# Patient Record
Sex: Female | Born: 1970 | Race: White | Hispanic: No | Smoking: Current every day smoker
Health system: Southern US, Community
[De-identification: ages and names within clinical notes are randomized; demographics above are authoritative.]

## PROBLEM LIST (undated history)

## (undated) DIAGNOSIS — N730 Acute parametritis and pelvic cellulitis: Secondary | ICD-10-CM

## (undated) DIAGNOSIS — N63 Unspecified lump in unspecified breast: Secondary | ICD-10-CM

## (undated) DIAGNOSIS — N879 Dysplasia of cervix uteri, unspecified: Secondary | ICD-10-CM

## (undated) DIAGNOSIS — F1910 Other psychoactive substance abuse, uncomplicated: Secondary | ICD-10-CM

## (undated) DIAGNOSIS — M1990 Unspecified osteoarthritis, unspecified site: Secondary | ICD-10-CM

## (undated) DIAGNOSIS — J40 Bronchitis, not specified as acute or chronic: Secondary | ICD-10-CM

## (undated) DIAGNOSIS — M419 Scoliosis, unspecified: Secondary | ICD-10-CM

## (undated) DIAGNOSIS — IMO0002 Reserved for concepts with insufficient information to code with codable children: Secondary | ICD-10-CM

## (undated) DIAGNOSIS — N390 Urinary tract infection, site not specified: Secondary | ICD-10-CM

## (undated) DIAGNOSIS — F419 Anxiety disorder, unspecified: Secondary | ICD-10-CM

## (undated) HISTORY — PX: LEEP: SHX91

## (undated) HISTORY — PX: DILATION AND CURETTAGE OF UTERUS: SHX78

## (undated) HISTORY — PX: COLPOSCOPY: SHX161

## (undated) HISTORY — PX: CERVICAL BIOPSY  W/ LOOP ELECTRODE EXCISION: SUR135

## (undated) HISTORY — PX: BREAST SURGERY: SHX581

## (undated) HISTORY — PX: INDUCED ABORTION: SHX677

---

## 1990-12-21 DIAGNOSIS — IMO0002 Reserved for concepts with insufficient information to code with codable children: Secondary | ICD-10-CM

## 1990-12-21 DIAGNOSIS — R87619 Unspecified abnormal cytological findings in specimens from cervix uteri: Secondary | ICD-10-CM

## 1990-12-21 HISTORY — DX: Unspecified abnormal cytological findings in specimens from cervix uteri: R87.619

## 1990-12-21 HISTORY — DX: Reserved for concepts with insufficient information to code with codable children: IMO0002

## 1998-06-22 ENCOUNTER — Emergency Department (HOSPITAL_COMMUNITY): Admission: EM | Admit: 1998-06-22 | Discharge: 1998-06-22 | Payer: Self-pay | Admitting: Emergency Medicine

## 2001-12-20 ENCOUNTER — Inpatient Hospital Stay (HOSPITAL_COMMUNITY): Admission: EM | Admit: 2001-12-20 | Discharge: 2001-12-28 | Payer: Self-pay | Admitting: Psychiatry

## 2007-09-07 ENCOUNTER — Emergency Department (HOSPITAL_COMMUNITY): Admission: EM | Admit: 2007-09-07 | Discharge: 2007-09-07 | Payer: Self-pay | Admitting: Emergency Medicine
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
## 2011-05-08 NOTE — Discharge Summary (Signed)
[facility]  Patient:    Kristine Mccarthy, Kristine Mccarthy Visit Number: [id number] MRN: [id number]          Service Type: PSY Location: 300 [id number] 01 Attending Physician:  Rachael Fee Dictated by:   Jeanice Lim, M.D. Admit Date:  12/20/2001 Discharge Date: 12/28/2001                             Discharge Summary  IDENTIFYING DATA:  This is a 40 year old divorced [name] voluntarily admitted for depression and suicidal ideation.  PAST PSYCHIATRIC HISTORY:  The patient has been detoxed in the past at [facility] and this is the first hospitalization at [facility].  The patient has a heavy alcohol use history, using crack cocaine for the last six months.  MEDICATIONS:  On Paxil in the past.  Has been off medications for eight months.  ALLERGIES:  No known drug allergies.  PHYSICAL EXAMINATION:  Unremarkable.  Neurologically nonfocal.  Been diagnosed recently with PID.  LABORATORY DATA:  Routine admission labs were essentially within normal limits.  Urine tox screen negative.  CBC with differential within normal limits.  Thyroid panel within normal limits.  MENTAL STATUS EXAMINATION:  Alert, young, middle-aged, casually dressed, cooperative [name] maintaining good eye contact.  Speech within normal limits.  Mood depressed and anxious.  Affect flat.  Thought processes goal directed.  Thought content with no psychotic symptoms.  No auditory or visual hallucinations.  No suicidal or homicidal ideation.  Cognitively intact.  Judgment and insight fair to poor.  ADMISSION DIAGNOSES: Axis I:    1. Major depressive disorder, recurrent, severe.            2. Cocaine dependence, in partial remission.            3. Alcohol abuse. Axis II:   None. Axis III:  1. Pelvic inflammatory disease.            2. Fractured fourth finger of the right hand. Axis IV:   Moderate (problems with primary support group). Axis V:    35/65.  HOSPITAL COURSE:  The patient was  ordered routine p.r.n. medications and Vistaril p.r.n.  Started on Zoloft and Seroquel p.r.n. agitation and anxiety. Neurontin was titrated on the Zoloft and Seroquel and the patient tolerated these well.  The patient tolerated medication changes without side effects, showed motivation regarding a substance abuse treatment plan.  CONDITION ON DISCHARGE:  Markedly improved with no depressive symptoms, no suicidal ideation and motivation to be abstinent.  DISCHARGE MEDICATIONS: 1. Neurontin 300 mg t.i.d. 2. Seroquel 100 mg, 1-1/2 t.i.d. 3. Zoloft 100 mg q.a.m. 4. Vistaril 50 mg q.6h.  FOLLOW-UP:  [facility] on January 02, 2002 at 8:30 a.m.  DISCHARGE DIAGNOSES: Axis I:    1. Major depressive disorder, recurrent, severe.            2. Cocaine dependence, in partial remission.            3. Alcohol abuse. Axis II:   None. Axis III:  1. Pelvic inflammatory disease.            2. Fractured fourth finger of the right hand. Axis IV:   Moderate (problems with primary support group). Axis V:    Global Assessment of Functioning on discharge 55. Dictated by:   Jeanice Lim, M.D. Attending Physician:  Rachael Fee DD:  02/01/02 TD:  02/02/02 Job: [id number]

## 2011-10-01 ENCOUNTER — Encounter (HOSPITAL_COMMUNITY): Payer: Self-pay

## 2011-10-01 ENCOUNTER — Inpatient Hospital Stay (HOSPITAL_COMMUNITY): Payer: Self-pay

## 2011-10-01 ENCOUNTER — Inpatient Hospital Stay (HOSPITAL_COMMUNITY)
Admission: AD | Admit: 2011-10-01 | Discharge: 2011-10-01 | Disposition: A | Discharge status: Home / Self Care | Payer: Self-pay | Source: Ambulatory Visit | Attending: Obstetrics & Gynecology | Admitting: Obstetrics & Gynecology

## 2011-10-01 DIAGNOSIS — O009 Unspecified ectopic pregnancy without intrauterine pregnancy: Secondary | ICD-10-CM

## 2011-10-01 DIAGNOSIS — O209 Hemorrhage in early pregnancy, unspecified: Secondary | ICD-10-CM | POA: Insufficient documentation

## 2011-10-01 HISTORY — DX: Anxiety disorder, unspecified: F41.9

## 2011-10-01 HISTORY — DX: Reserved for concepts with insufficient information to code with codable children: IMO0002

## 2011-10-01 HISTORY — DX: Dysplasia of cervix uteri, unspecified: N87.9

## 2011-10-01 LAB — URINALYSIS, ROUTINE W REFLEX MICROSCOPIC
Bilirubin Urine: NEGATIVE
Nitrite: NEGATIVE
Specific Gravity, Urine: 1.015 (ref 1.005–1.030)
Urobilinogen, UA: 0.2 mg/dL (ref 0.0–1.0)

## 2011-10-01 LAB — CREATININE, SERUM: Creatinine, Ser: 0.66 mg/dL (ref 0.50–1.10)

## 2011-10-01 LAB — CBC
HCT: 41.7 % (ref 36.0–46.0)
Hemoglobin: 13.9 g/dL (ref 12.0–15.0)
MCH: 30.5 pg (ref 26.0–34.0)
MCHC: 33.3 g/dL (ref 30.0–36.0)

## 2011-10-01 LAB — WET PREP, GENITAL

## 2011-10-01 LAB — HCG, QUANTITATIVE, PREGNANCY: hCG, Beta Chain, Quant, S: 2450 m[IU]/mL — ABNORMAL HIGH (ref <5)

## 2011-10-01 LAB — BUN: BUN: 12 mg/dL (ref 6–23)

## 2011-10-01 MED ORDER — METHOTREXATE INJECTION FOR WOMEN'S HOSPITAL
50.0000 mg/m2 | Freq: Once | INTRAMUSCULAR | Status: AC
  Administered 2011-10-01: 85 mg via INTRAMUSCULAR
  Filled 2011-10-01: qty 1.7

## 2011-10-01 NOTE — Progress Notes (Signed)
Pt states she had a POS HPT yesterday. Pt has been having some irregular bleeding, more after intercourse last night with some irregular cramping. Pt has been taking some diet pills and medicine for her cold and is concerned about it. Nausea last week, no vomiting.

## 2011-10-01 NOTE — ED Provider Notes (Signed)
History     Chief Complaint  Patient presents with  . Vaginal Bleeding   Patient is a 40 y.o. female presenting with vaginal bleeding. The history is provided by the patient.  Vaginal Bleeding This is a new problem. The current episode started yesterday. The problem occurs daily. The problem has been unchanged. Pertinent negatives include no abdominal pain, fever, nausea or vomiting.   States started bleeding yesterday after having a missed period. Took a pregnancy test which were positive. Has hx Abn Pap ("level III") and LEEP. States they "got it all".   Past Medical History  Diagnosis Date  . Abnormal Pap smear 1992    Leep, biopsy  . Anxiety   . Cervical dysplasia     level 3  . Depression     Adm. for Suicidal Ideation and Drug use in 2003    Past Surgical History  Procedure Date  . No past surgeries     History reviewed. No pertinent family history.  History  Substance Use Topics  . Smoking status: Current Everyday Smoker -- 1.0 packs/day for 26 years    Types: Cigarettes  . Smokeless tobacco: Not on file   Comment: pt states will quit b/c she is pregnant  . Alcohol Use: No    Allergies: No Known Allergies  Prescriptions prior to admission  Medication Sig Dispense Refill  . bisacodyl (EX-LAX ULTRA) 5 MG EC tablet Take 5 mg by mouth daily as needed.        Marland Kitchen guaiFENesin (MUCINEX) 600 MG 12 hr tablet Take 1,200 mg by mouth 2 (two) times daily. Patient was using this medication for congestion.       . hydrocortisone 0.5 % cream Apply 1 application topically 2 (two) times daily. Patient is using this medication for a bug bite.       Marland Kitchen ibuprofen (ADVIL,MOTRIN) 200 MG tablet Take 400 mg by mouth every 6 (six) hours as needed. Patient is using this medication for pain.       . Pseudoeph-Doxylamine-DM-APAP (NYQUIL) 60-7.05-19-999 MG/30ML LIQD Take 5 mLs by mouth daily as needed. Patient is using this medication for cold symptoms.         Review of Systems    Constitutional: Negative for fever.  Gastrointestinal: Negative for nausea, vomiting and abdominal pain.  Genitourinary: Positive for vaginal bleeding.   Physical Exam   Blood pressure 126/70, pulse 82, temperature 98.8 F (37.1 C), temperature source Oral, resp. rate 20, height 5\' 4"  (1.626 m), weight 147 lb 3.2 oz (66.769 kg), last menstrual period 08/05/2011, SpO2 98.00%.  Physical Exam  Constitutional: She is oriented to person, place, and time. She appears well-developed and well-nourished.  HENT:  Head: Normocephalic.  Respiratory: Effort normal.  GI: Soft. She exhibits no distension. There is no tenderness. There is no rebound and no guarding.  Genitourinary: Uterus normal. Vaginal discharge (moderate bloody discharge, uterus retroverted, cervix shortened due to LEEP) found.       Moderate tenderness to left of uterus which is retroverted. Slight tenderness over right adnexa.  Neurological: She is alert and oriented to person, place, and time.  Skin: Skin is warm and dry.  Psychiatric: She has a normal mood and affect.    MAU Course  Procedures  Assessment and Plan  A:  Pregnancy at 8.1 weeks First Trimester Bleeding  P:  Quant, ABO/Rh, CBC, and [place] to assess fetal well-being.   [facility] 10/01/2011, 4:17 PM   Results for orders placed during the hospital encounter  of 10/01/11 (from the past 24 hour(s))  URINALYSIS, ROUTINE W REFLEX MICROSCOPIC     Status: Normal   Collection Time   10/01/11  1:00 PM      Component Value Range   Color, Urine YELLOW  YELLOW    Appearance CLEAR  CLEAR    Specific Gravity, Urine 1.015  1.005 - 1.030    pH 8.0  5.0 - 8.0    Glucose, UA NEGATIVE  NEGATIVE (mg/dL)   Hgb urine dipstick NEGATIVE  NEGATIVE    Bilirubin Urine NEGATIVE  NEGATIVE    Ketones, ur NEGATIVE  NEGATIVE (mg/dL)   Protein, ur NEGATIVE  NEGATIVE (mg/dL)   Urobilinogen, UA 0.2  0.0 - 1.0 (mg/dL)   Nitrite NEGATIVE  NEGATIVE    Leukocytes, UA NEGATIVE   NEGATIVE   POCT PREGNANCY, URINE     Status: Normal   Collection Time   10/01/11  3:07 PM      Component Value Range   Preg Test, Ur POSITIVE    WET PREP, GENITAL     Status: Abnormal   Collection Time   10/01/11  3:57 PM      Component Value Range   Yeast, Wet Prep NONE SEEN  NONE SEEN    Trich, Wet Prep NONE SEEN  NONE SEEN    Clue Cells, Wet Prep FEW (*) NONE SEEN    WBC, Wet Prep HPF POC FEW (*) NONE SEEN   CBC     Status: Abnormal   Collection Time   10/01/11  4:03 PM      Component Value Range   WBC 10.6 (*) 4.0 - 10.5 (K/uL)   RBC 4.55  3.87 - 5.11 (MIL/uL)   Hemoglobin 13.9  12.0 - 15.0 (g/dL)   HCT 04.5  40.9 - 81.1 (%)   MCV 91.6  78.0 - 100.0 (fL)   MCH 30.5  26.0 - 34.0 (pg)   MCHC 33.3  30.0 - 36.0 (g/dL)   RDW 91.4  78.2 - 95.6 (%)   Platelets 212  150 - 400 (K/uL)  HCG, QUANTITATIVE, PREGNANCY     Status: Abnormal   Collection Time   10/01/11  4:04 PM      Component Value Range   hCG, Beta Chain, Quant, S 2450 (*) <5 (mIU/mL)  ABO/RH     Status: Normal   Collection Time   10/01/11  4:04 PM      Component Value Range   ABO/RH(D) B POS     Results reviewed. Ultrasound shows probable Right Ectopic pregnancy adjacent to Right Corpus Luteum Cyst. Discussed with Dr Penne Lash who recommends Methotrexate. Discussed with patient who is agreeable.   Will d/c home after injection and have her followup per protocol.

## 2011-10-01 NOTE — ED Provider Notes (Signed)
Agree with above note.  Kristine Stills H. 10/01/2011 11:27 PM

## 2011-10-02 ENCOUNTER — Inpatient Hospital Stay (HOSPITAL_COMMUNITY): Payer: Self-pay

## 2011-10-02 ENCOUNTER — Inpatient Hospital Stay (HOSPITAL_COMMUNITY)
Admission: AD | Admit: 2011-10-02 | Discharge: 2011-10-02 | Disposition: A | Discharge status: Home / Self Care | Payer: Self-pay | Source: Ambulatory Visit | Attending: Obstetrics & Gynecology | Admitting: Obstetrics & Gynecology

## 2011-10-02 DIAGNOSIS — R1031 Right lower quadrant pain: Secondary | ICD-10-CM | POA: Insufficient documentation

## 2011-10-02 DIAGNOSIS — O00109 Unspecified tubal pregnancy without intrauterine pregnancy: Secondary | ICD-10-CM | POA: Insufficient documentation

## 2011-10-02 DIAGNOSIS — O009 Unspecified ectopic pregnancy without intrauterine pregnancy: Secondary | ICD-10-CM

## 2011-10-02 LAB — CBC
MCH: 30.7 pg (ref 26.0–34.0)
MCHC: 34 g/dL (ref 30.0–36.0)
MCV: 90.3 fL (ref 78.0–100.0)
Platelets: 221 10*3/uL (ref 150–400)
RDW: 13.1 % (ref 11.5–15.5)

## 2011-10-02 LAB — GC/CHLAMYDIA PROBE AMP, GENITAL: GC Probe Amp, Genital: NEGATIVE

## 2011-10-02 NOTE — ED Provider Notes (Signed)
History     No chief complaint on file.  Abdominal Pain   Pt here earlier today, diagnosed with right ectopic and treated with methotrexate. About 1 hour ago began having increased RLQ pain, bleeding also slightly increased from time of discharge. States pain was 2/10 at discharge, now 10/10.  OB History    Grav Para Term Preterm Abortions TAB SAB Ect Mult Living   5 2 2  0 2 1 1  0 0 2      Past Medical History  Diagnosis Date  . Abnormal Pap smear 1992    Leep, biopsy  . Anxiety   . Cervical dysplasia     level 3  . Depression     Adm. for Suicidal Ideation and Drug use in 2003    Past Surgical History  Procedure Date  . No past surgeries     No family history on file.  History  Substance Use Topics  . Smoking status: Current Everyday Smoker -- 1.0 packs/day for 26 years    Types: Cigarettes  . Smokeless tobacco: Not on file   Comment: pt states will quit b/c she is pregnant  . Alcohol Use: No    Allergies: No Known Allergies  Prescriptions prior to admission  Medication Sig Dispense Refill  . bisacodyl (EX-LAX ULTRA) 5 MG EC tablet Take 5 mg by mouth daily as needed.        Marland Kitchen guaiFENesin (MUCINEX) 600 MG 12 hr tablet Take 1,200 mg by mouth 2 (two) times daily. Patient was using this medication for congestion.       . hydrocortisone 0.5 % cream Apply 1 application topically 2 (two) times daily. Patient is using this medication for a bug bite.       Marland Kitchen ibuprofen (ADVIL,MOTRIN) 200 MG tablet Take 400 mg by mouth every 6 (six) hours as needed. Patient is using this medication for pain.       . Pseudoeph-Doxylamine-DM-APAP (NYQUIL) 60-7.05-19-999 MG/30ML LIQD Take 5 mLs by mouth daily as needed. Patient is using this medication for cold symptoms.         Review of Systems  Cardiovascular: Negative.   Gastrointestinal: Positive for abdominal pain.  Genitourinary:       + vaginal bleeding and pain  Neurological: Negative.    Physical Exam   Last menstrual period  08/05/2011.  Physical Exam  Nursing note and vitals reviewed. Constitutional: She is oriented to person, place, and time. She appears well-developed. She appears distressed.  Respiratory: Effort normal.  GI: Soft. There is tenderness (RLQ). There is guarding. There is no rebound.  Neurological: She is alert and oriented to person, place, and time.  Skin: Skin is warm and dry.  Psychiatric: Her mood appears anxious.    MAU Course  Procedures  Results for orders placed during the hospital encounter of 10/02/11 (from the past 24 hour(s))  CBC     Status: Abnormal   Collection Time   10/02/11 12:35 AM      Component Value Range   WBC 16.9 (*) 4.0 - 10.5 (K/uL)   RBC 4.53  3.87 - 5.11 (MIL/uL)   Hemoglobin 13.9  12.0 - 15.0 (g/dL)   HCT 40.9  81.1 - 91.4 (%)   MCV 90.3  78.0 - 100.0 (fL)   MCH 30.7  26.0 - 34.0 (pg)   MCHC 34.0  30.0 - 36.0 (g/dL)   RDW 78.2  95.6 - 21.3 (%)   Platelets 221  150 - 400 (K/uL)  US Ob Comp Less 14 Wks  10/01/2011  OBSTETRICAL ULTRASOUND: This exam was performed within a [facility]. The OB US report was generated in the [facility] system, and faxed to the ordering physician.   This report is also available in [employer] and in the [employer]. See [facility] Obstetric US report.   US Ob Transvaginal  10/02/2011  *RADIOLOGY REPORT*  Clinical Data: Right adnexal ectopic pregnancy identified yesterday for which the patient received methotrexate.  Increasing pelvic pain.  TRANSVAGINAL OBSTETRIC US 10/02/2011:  Technique:  Transvaginal ultrasound was performed for complete evaluation of the gestation as well as the maternal uterus, adnexal regions, and pelvic cul-de-sac.  Comparison:  Early OB ultrasound yesterday.  Findings: The ectopic pregnancy in the right adnexa measures approximately 3.4 x 2.1 x 2.4 cm, slightly increased in size since yesterday.  Minimal adjacent free fluid is present in the right adnexa, and this fluid  appears simple.  No intrauterine gestational sac is identified, and the uterus is normal in appearance.  The left ovary is normal in appearance, containing small follicles.  IMPRESSION: Slight increase in size in the ectopic pregnancy mass in the right adnexa since yesterday, likely due to hemorrhage.  Minimal amount of simple appearing fluid in the right adnexa.  No convincing sonographic findings of rupture.  Original Report Authenticated By: Arnell Sieving, M.D.   US Ob Transvaginal  10/01/2011  OBSTETRICAL ULTRASOUND: This exam was performed within a  Ultrasound Department. The OB US report was generated in the [facility] system, and faxed to the ordering physician.   This report is also available in [employer] and in the [employer]. See [facility] Obstetric US report.    Assessment and Plan    FRAZIER,NATALIE 10/02/2011, 12:18 AM   Pt seen and examined.  Pt has no pain right now.  No rebound or guarding.  US shows no signs of rupture.  Pt was anxious when she came in, but much better now.  Discussed operating now vs waiting.  Pt will wait and see if MTX works.  Pt understands there is always a risk of rupture with an ectopic. Kylor Valverde H. 1:34 AM

## 2011-10-02 NOTE — Progress Notes (Signed)
Dr. Penne Lash at bedside.  [place] and lab results discussed with pt.

## 2011-10-02 NOTE — Progress Notes (Signed)
N. Frazier, CNM at bedside.  Assessment done and poc discussed with pt.  

## 2011-10-02 NOTE — Progress Notes (Signed)
Pt presents to [facility] for c/o pain that started about an hour ago.  Was here earlier and found to have ectopic pregnancy.  Was given methotrexate.

## 2011-10-04 ENCOUNTER — Inpatient Hospital Stay (HOSPITAL_COMMUNITY)
Admission: AD | Admit: 2011-10-04 | Discharge: 2011-10-04 | Disposition: A | Discharge status: Home / Self Care | Payer: Self-pay | Source: Ambulatory Visit | Attending: Obstetrics & Gynecology | Admitting: Obstetrics & Gynecology

## 2011-10-04 ENCOUNTER — Inpatient Hospital Stay (HOSPITAL_COMMUNITY): Admit: 2011-10-04 | Payer: Self-pay

## 2011-10-04 DIAGNOSIS — O00109 Unspecified tubal pregnancy without intrauterine pregnancy: Secondary | ICD-10-CM | POA: Insufficient documentation

## 2011-10-04 DIAGNOSIS — O009 Unspecified ectopic pregnancy without intrauterine pregnancy: Secondary | ICD-10-CM

## 2011-10-04 LAB — HCG, QUANTITATIVE, PREGNANCY: hCG, Beta Chain, Quant, S: 2668 m[IU]/mL — ABNORMAL HIGH (ref <5)

## 2011-10-04 NOTE — ED Notes (Signed)
E.Rice,PA discussed results and follow up labs. Pt verbalized understanding.

## 2011-10-04 NOTE — ED Provider Notes (Signed)
History   Pt presents today for repeat B-quant on day 4 s/p methotrexate for ectopic pregnancy. She states she is doing well but continues to have some lower abd cramping and is now have moderate vag bleeding. She denies fever, vag irritation, or worsening pain. She has no other complaints at this time.  Chief Complaint  Patient presents with  . Ectopic Pregnancy   HPI  OB History    Grav Para Term Preterm Abortions TAB SAB Ect Mult Living   5 2 2  0 2 1 1  0 0 2      Past Medical History  Diagnosis Date  . Abnormal Pap smear 1992    Leep, biopsy  . Anxiety   . Cervical dysplasia     level 3  . Depression     Adm. for Suicidal Ideation and Drug use in 2003    Past Surgical History  Procedure Date  . No past surgeries     No family history on file.  History  Substance Use Topics  . Smoking status: Current Everyday Smoker -- 1.0 packs/day for 26 years    Types: Cigarettes  . Smokeless tobacco: Not on file   Comment: pt states will quit b/c she is pregnant  . Alcohol Use: No    Allergies:  Allergies  Allergen Reactions  . Morphine And Related Nausea And Vomiting    Prescriptions prior to admission  Medication Sig Dispense Refill  . bisacodyl (EX-LAX ULTRA) 5 MG EC tablet Take 5 mg by mouth daily as needed.        Marland Kitchen guaiFENesin (MUCINEX) 600 MG 12 hr tablet Take 1,200 mg by mouth 2 (two) times daily. Patient was using this medication for congestion.       . hydrocortisone 0.5 % cream Apply 1 application topically 2 (two) times daily. Patient is using this medication for a bug bite.       . Pseudoeph-Doxylamine-DM-APAP (NYQUIL) 60-7.05-19-999 MG/30ML LIQD Take 5 mLs by mouth daily as needed. Patient is using this medication for cold symptoms.         Review of Systems  Constitutional: Negative for fever.  Cardiovascular: Negative for chest pain.  Gastrointestinal: Positive for abdominal pain. Negative for nausea, vomiting, diarrhea and constipation.    Genitourinary: Negative for dysuria, urgency, frequency and hematuria.  Neurological: Negative for dizziness and headaches.  Psychiatric/Behavioral: Negative for depression and suicidal ideas.   Physical Exam   Blood pressure 110/73, pulse 71, temperature 98.2 F (36.8 C), temperature source Oral, resp. rate 18, height 5\' 3"  (1.6 m), weight 147 lb (66.679 kg), last menstrual period 08/05/2011.  Physical Exam  Constitutional: She is oriented to person, place, and time. She appears well-developed and well-nourished. No distress.  HENT:  Head: Normocephalic and atraumatic.  Eyes: EOM are normal. Pupils are equal, round, and reactive to light.  GI: Soft. She exhibits no distension. There is no tenderness. There is no rebound and no guarding.  Neurological: She is alert and oriented to person, place, and time.  Skin: Skin is warm and dry. She is not diaphoretic.  Psychiatric: She has a normal mood and affect. Her behavior is normal. Judgment and thought content normal.    MAU Course  Procedures  Results for orders placed during the hospital encounter of 10/04/11 (from the past 24 hour(s))  HCG, QUANTITATIVE, PREGNANCY     Status: Abnormal   Collection Time   10/04/11 12:12 PM      Component Value Range   hCG,  Beta Chain, Quant, S 2668 (*) <5 (mIU/mL)     Assessment and Plan  Ectopic preg: discussed with pt at length. She will return on 10/07/11 for day 7 B-quant. Discussed diet, activity, risks, and precautions. Discussed signs and sx of ruptured ectopic.   Clinton Gallant. Trinda Harlacher III, DrHSc, MPAS, PA-C  10/04/2011, 12:56 PM   Henrietta Hoover, PA 10/04/11 1312

## 2011-10-04 NOTE — Progress Notes (Signed)
Pt reports still having lower abd pain and cramping.pt reportts having  Moderate vaginal bleeding as well.

## 2011-10-04 NOTE — ED Provider Notes (Signed)
History     Chief Complaint  Patient presents with  . Ectopic Pregnancy   HPI.Kristine Mccarthy [id number] y.o.    Past Medical History  Diagnosis Date  . Abnormal Pap smear 1992    Leep, biopsy  . Anxiety   . Cervical dysplasia     level 3  . Depression     Adm. for Suicidal Ideation and Drug use in 2003    Past Surgical History  Procedure Date  . No past surgeries     No family history on file.  History  Substance Use Topics  . Smoking status: Current Everyday Smoker -- 1.0 packs/day for 26 years    Types: Cigarettes  . Smokeless tobacco: Not on file   Comment: pt states will quit b/c she is pregnant  . Alcohol Use: No    Allergies:  Allergies  Allergen Reactions  . Morphine And Related Nausea And Vomiting    No prescriptions prior to admission    ROS Physical Exam   Blood pressure 110/73, pulse 71, temperature 98.2 F (36.8 C), temperature source Oral, resp. rate 18, height 5\' 3"  (1.6 m), weight 147 lb (66.679 kg), last menstrual period 08/05/2011.  Physical Exam  MAU Course  Procedures  MDM   Assessment and Plan   Disregard this note.  Wrong patient. KEY,EVE M 10/04/2011, 1:19 PM   Matt Holmes, NP 10/04/11 1333  Matt Holmes, NP 10/07/11 2113

## 2011-10-05 ENCOUNTER — Ambulatory Visit (HOSPITAL_COMMUNITY): Payer: Self-pay

## 2011-10-07 ENCOUNTER — Inpatient Hospital Stay (HOSPITAL_COMMUNITY)
Admission: AD | Admit: 2011-10-07 | Discharge: 2011-10-07 | Disposition: A | Discharge status: Home / Self Care | Payer: Self-pay | Source: Ambulatory Visit | Attending: Obstetrics & Gynecology | Admitting: Obstetrics & Gynecology

## 2011-10-07 DIAGNOSIS — O00109 Unspecified tubal pregnancy without intrauterine pregnancy: Secondary | ICD-10-CM | POA: Insufficient documentation

## 2011-10-07 DIAGNOSIS — O009 Unspecified ectopic pregnancy without intrauterine pregnancy: Secondary | ICD-10-CM

## 2011-10-07 LAB — DIFFERENTIAL
Eosinophils Absolute: 0.1 10*3/uL (ref 0.0–0.7)
Lymphocytes Relative: 18 % (ref 12–46)
Lymphs Abs: 1.5 10*3/uL (ref 0.7–4.0)
Neutro Abs: 6.3 10*3/uL (ref 1.7–7.7)
Neutrophils Relative %: 75 % (ref 43–77)

## 2011-10-07 LAB — CREATININE, SERUM
Creatinine, Ser: 0.65 mg/dL (ref 0.50–1.10)
GFR calc Af Amer: >90 mL/min (ref >90)
GFR calc non Af Amer: >90 mL/min (ref >90)

## 2011-10-07 LAB — BUN: BUN: 7 mg/dL (ref 6–23)

## 2011-10-07 LAB — CBC
Platelets: 237 10*3/uL (ref 150–400)
RBC: 3.96 MIL/uL (ref 3.87–5.11)
WBC: 8.4 10*3/uL (ref 4.0–10.5)

## 2011-10-07 LAB — AST: AST: 12 U/L (ref 0–37)

## 2011-10-07 LAB — HCG, QUANTITATIVE, PREGNANCY: hCG, Beta Chain, Quant, S: 2241 m[IU]/mL — ABNORMAL HIGH (ref <5)

## 2011-10-07 MED ORDER — METHOTREXATE INJECTION FOR WOMEN'S HOSPITAL
50.0000 mg/m2 | Freq: Once | INTRAMUSCULAR | Status: AC
  Administered 2011-10-07: 85 mg via INTRAMUSCULAR
  Filled 2011-10-07: qty 1.7

## 2011-10-07 NOTE — ED Provider Notes (Signed)
History   Pt presents today for B-quant on day 7 s/p methotrexate for ectopic preg. She states she is doing well and is feeling much improved. Her abd pain has almost completely resolved as has her bleeding. She denies fever or any other sx at this time.  Chief Complaint  Patient presents with  . Follow-up   HPI  OB History    Grav Para Term Preterm Abortions TAB SAB Ect Mult Living   5 2 2  0 2 1 1  0 0 2      Past Medical History  Diagnosis Date  . Abnormal Pap smear 1992    Leep, biopsy  . Anxiety   . Cervical dysplasia     level 3  . Depression     Adm. for Suicidal Ideation and Drug use in 2003    Past Surgical History  Procedure Date  . No past surgeries     No family history on file.  History  Substance Use Topics  . Smoking status: Current Everyday Smoker -- 1.0 packs/day for 26 years    Types: Cigarettes  . Smokeless tobacco: Not on file   Comment: pt states will quit b/c she is pregnant  . Alcohol Use: No    Allergies:  Allergies  Allergen Reactions  . Morphine And Related Nausea And Vomiting    Prescriptions prior to admission  Medication Sig Dispense Refill  . bisacodyl (EX-LAX ULTRA) 5 MG EC tablet Take 5 mg by mouth daily as needed.        Marland Kitchen guaiFENesin (MUCINEX) 600 MG 12 hr tablet Take 1,200 mg by mouth 2 (two) times daily. Patient was using this medication for congestion.       . hydrocortisone 0.5 % cream Apply 1 application topically 2 (two) times daily. Patient is using this medication for a bug bite.       . Pseudoeph-Doxylamine-DM-APAP (NYQUIL) 60-7.05-19-999 MG/30ML LIQD Take 5 mLs by mouth daily as needed. Patient is using this medication for cold symptoms.         Review of Systems  Constitutional: Negative for fever.  Cardiovascular: Negative for chest pain.  Gastrointestinal: Negative for nausea, vomiting, abdominal pain, diarrhea and constipation.  Genitourinary: Negative for dysuria, urgency and frequency.  Neurological: Negative  for dizziness and headaches.  Psychiatric/Behavioral: Negative for depression and suicidal ideas.   Physical Exam   Blood pressure 129/75, pulse 88, temperature 99 F (37.2 C), temperature source Oral, resp. rate 20, last menstrual period 08/05/2011.  Physical Exam  Nursing note and vitals reviewed. Constitutional: She is oriented to person, place, and time. She appears well-developed and well-nourished. No distress.  HENT:  Head: Normocephalic and atraumatic.  Eyes: EOM are normal. Pupils are equal, round, and reactive to light.  GI: Soft. She exhibits no distension. There is no tenderness. There is no rebound and no guarding.  Neurological: She is alert and oriented to person, place, and time.  Skin: Skin is warm and dry. She is not diaphoretic.  Psychiatric: She has a normal mood and affect. Her behavior is normal. Judgment and thought content normal.    MAU Course  Procedures  B-quant on 10/01/11 - 2450                    10/04/11 - 2668                    10/07/11 - 2241  Discussed pt with Dr. Macon Large. Will give 2nd dose of methotrexate.  Assessment and Plan  Ectopic preg: discussed with pt at length. She will receive 2nd dose of methotrexate. She will return on 10/10/11 for day 4 B-quant. Discussed signs and sx of ruptured ectopic preg with pt at length. She will return immediately to the MAU with worsening sx. Discussed diet, activity, risks, and precautions. She understood and agreed.  Clinton Gallant. Rice III, DrHSc, MPAS, PA-C  10/07/2011, 11:14 AM   Henrietta Hoover, PA 10/07/11 1118

## 2011-10-07 NOTE — Progress Notes (Signed)
Pt to MAU for day 7 BHCG s/p MTX. Pt states she is not having any bleeding and no pain at this time. Does take Tylenol occasionally for discomfort.

## 2011-10-08 NOTE — ED Provider Notes (Signed)
Attestation of Attending Supervision of Advanced Practitioner: Evaluation and management procedures were performed by the PA/NP/CNM/OB Fellow under my supervision/collaboration. Chart reviewed and agree with management and plan.  Shanetha Bradham A 10/08/2011 2:53 PM

## 2011-10-10 ENCOUNTER — Inpatient Hospital Stay (HOSPITAL_COMMUNITY)
Admission: AD | Admit: 2011-10-10 | Discharge: 2011-10-10 | Disposition: A | Discharge status: Home / Self Care | Payer: Self-pay | Source: Ambulatory Visit | Attending: Obstetrics & Gynecology | Admitting: Obstetrics & Gynecology

## 2011-10-10 DIAGNOSIS — O009 Unspecified ectopic pregnancy without intrauterine pregnancy: Secondary | ICD-10-CM | POA: Diagnosis present

## 2011-10-10 DIAGNOSIS — R109 Unspecified abdominal pain: Secondary | ICD-10-CM | POA: Insufficient documentation

## 2011-10-10 DIAGNOSIS — J40 Bronchitis, not specified as acute or chronic: Secondary | ICD-10-CM

## 2011-10-10 DIAGNOSIS — O00109 Unspecified tubal pregnancy without intrauterine pregnancy: Secondary | ICD-10-CM | POA: Insufficient documentation

## 2011-10-10 MED ORDER — AMOXICILLIN-POT CLAVULANATE 875-125 MG PO TABS: 1.0000 | ORAL_TABLET | Freq: Two times a day (BID) | ORAL | Status: AC

## 2011-10-10 MED ORDER — ALBUTEROL SULFATE HFA 108 (90 BASE) MCG/ACT IN AERS: 2.0000 | INHALATION_SPRAY | RESPIRATORY_TRACT | Status: DC | PRN

## 2011-10-10 NOTE — ED Provider Notes (Signed)
Attestation of Attending Supervision of Advanced Practitioner: Evaluation and management procedures were performed by the PA/NP/CNM/OB Fellow under my supervision/collaboration. Chart reviewed and agree with management and plan.  Daly Whipkey A 10/10/2011 4:53 PM

## 2011-10-10 NOTE — Progress Notes (Signed)
Received second shot of Methotrexate 4 days ago patient has cough, having lower abdominal pain.

## 2011-10-10 NOTE — ED Provider Notes (Signed)
History     Chief Complaint  Patient presents with  . Abdominal Pain  . Ectopic Pregnancy   HPI Day 4 s/p MTX, some low abdomen pain, not severe, no bleeding. Also c/o productive cough with green sputum x 3 weeks.   OB History    Grav Para Term Preterm Abortions TAB SAB Ect Mult Living   5 2 2  0 2 1 1  0 0 2      Past Medical History  Diagnosis Date  . Abnormal Pap smear 1992    Leep, biopsy  . Anxiety   . Cervical dysplasia     level 3  . Depression     Adm. for Suicidal Ideation and Drug use in 2003    Past Surgical History  Procedure Date  . No past surgeries     No family history on file.  History  Substance Use Topics  . Smoking status: Current Everyday Smoker -- 1.0 packs/day for 26 years    Types: Cigarettes  . Smokeless tobacco: Not on file   Comment: pt states will quit b/c she is pregnant  . Alcohol Use: No    Allergies:  Allergies  Allergen Reactions  . Morphine And Related Nausea And Vomiting    Prescriptions prior to admission  Medication Sig Dispense Refill  . bisacodyl (EX-LAX ULTRA) 5 MG EC tablet Take 5 mg by mouth daily as needed.        Marland Kitchen guaiFENesin (MUCINEX) 600 MG 12 hr tablet Take 1,200 mg by mouth 2 (two) times daily. Patient was using this medication for congestion.       . hydrocortisone 0.5 % cream Apply 1 application topically 2 (two) times daily. Patient is using this medication for a bug bite.       . Pseudoeph-Doxylamine-DM-APAP (NYQUIL) 60-7.05-19-999 MG/30ML LIQD Take 5 mLs by mouth daily as needed. Patient is using this medication for cold symptoms.         Review of Systems  Constitutional: Negative.   HENT: Negative.   Respiratory: Positive for cough.   Cardiovascular: Negative.   Gastrointestinal: Positive for abdominal pain.  Genitourinary: Negative.   Musculoskeletal: Negative.   Psychiatric/Behavioral: Negative.    Physical Exam   Blood pressure 107/78, pulse 94, temperature 98.3 F (36.8 C), temperature  source Oral, resp. rate 18, last menstrual period 08/05/2011.  Physical Exam  Constitutional: She is oriented to person, place, and time. She appears well-developed and well-nourished. No distress.  Cardiovascular: Normal rate and regular rhythm.   Respiratory: Effort normal. She has wheezes.  Musculoskeletal: Normal range of motion.  Neurological: She is alert and oriented to person, place, and time.  Skin: Skin is warm and dry.  Psychiatric: She has a normal mood and affect.    MAU Course  Procedures  Results for orders placed during the hospital encounter of 10/10/11 (from the past 24 hour(s))  HCG, QUANTITATIVE, PREGNANCY     Status: Abnormal   Collection Time   10/10/11  1:12 PM      Component Value Range   hCG, Beta Chain, Quant, S 1725 (*) <5 (mIU/mL)     Assessment and Plan  Ectopic pregnancy s/p MTX - decreasing HCG, Return on day 7 for repeat quant, precautions rev'd Bronchitis - augmentin, albuterol    FRAZIER,NATALIE 10/10/2011, 2:05 PM

## 2011-10-13 ENCOUNTER — Inpatient Hospital Stay (HOSPITAL_COMMUNITY)
Admission: AD | Admit: 2011-10-13 | Discharge: 2011-10-13 | Disposition: A | Discharge status: Home / Self Care | Payer: Self-pay | Source: Ambulatory Visit | Attending: Obstetrics & Gynecology | Admitting: Obstetrics & Gynecology

## 2011-10-13 DIAGNOSIS — O00209 Unspecified ovarian pregnancy without intrauterine pregnancy: Secondary | ICD-10-CM

## 2011-10-13 DIAGNOSIS — O00109 Unspecified tubal pregnancy without intrauterine pregnancy: Secondary | ICD-10-CM | POA: Insufficient documentation

## 2011-10-13 NOTE — ED Provider Notes (Signed)
History     Chief Complaint  Patient presents with  . Follow-up   HPI Kristine Mccarthy [id number] y.o.returns for follow up BHCG after Methotrexate for ectopic pregnancy.  BHCG on 10/14 2668, 10/17 2241, and 10/20 1725.  This is Day7 after second MTX injection.  She denies pain today but does report light bleeding.  Treated last visit for Bronchitis. Beginning to feel better.  Using cough med, antibiotic and inhaler.   Past Medical History  Diagnosis Date  . Abnormal Pap smear 1992    Leep, biopsy  . Anxiety   . Cervical dysplasia     level 3  . Depression     Adm. for Suicidal Ideation and Drug use in 2003    Past Surgical History  Procedure Date  . No past surgeries     No family history on file.  History  Substance Use Topics  . Smoking status: Current Everyday Smoker -- 1.0 packs/day for 26 years    Types: Cigarettes  . Smokeless tobacco: Not on file   Comment: pt states will quit b/c she is pregnant  . Alcohol Use: No    Allergies:  Allergies  Allergen Reactions  . Morphine And Related Nausea And Vomiting    Prescriptions prior to admission  Medication Sig Dispense Refill  . albuterol (PROVENTIL HFA;VENTOLIN HFA) 108 (90 BASE) MCG/ACT inhaler Inhale 2 puffs into the lungs every 4 (four) hours as needed for wheezing or shortness of breath.  1 Inhaler  0  . amoxicillin-clavulanate (AUGMENTIN) 875-125 MG per tablet Take 1 tablet by mouth 2 (two) times daily.  14 tablet  0  . bisacodyl (EX-LAX ULTRA) 5 MG EC tablet Take 5 mg by mouth daily as needed.        Marland Kitchen guaiFENesin (MUCINEX) 600 MG 12 hr tablet Take 1,200 mg by mouth 2 (two) times daily. Patient was using this medication for congestion.       . hydrocortisone 0.5 % cream Apply 1 application topically 2 (two) times daily. Patient is using this medication for a bug bite.       . Pseudoeph-Doxylamine-DM-APAP (NYQUIL) 60-7.05-19-999 MG/30ML LIQD Take 5 mLs by mouth daily as needed. Patient is using this medication for  cold symptoms.         Review of Systems  Respiratory: Positive for cough.   Gastrointestinal: Negative for abdominal pain.  Genitourinary:       Light vaginal bleeding   Physical Exam   Blood pressure 122/72, pulse 88, temperature 98.3 F (36.8 C), temperature source Oral, resp. rate 20, last menstrual period 08/05/2011.  Physical Exam  Not indicated Results for orders placed during the hospital encounter of 10/13/11 (from the past 24 hour(s))  HCG, QUANTITATIVE, PREGNANCY     Status: Abnormal   Collection Time   10/13/11 12:20 PM      Component Value Range   hCG, Beta Chain, Quant, S 1135 (*) <5 (mIU/mL)    MAU Course  Procedures  MDM   Assessment and Plan  A;  Followup after Metrotrexate for ectopic pregnancy  P:  Return for BHCG in 1 week.  Return sooner for increased abdominal pain or heavy vaginal bleeding.    Heath Badon,EVE M 10/13/2011, 12:53 PM   Matt Holmes, NP 10/13/11 1629  Matt Holmes, NP 10/15/11 1336

## 2011-10-13 NOTE — Progress Notes (Signed)
Patient to MAU for repeat BHCG day 7 s/p MTX second dose. Patient denies any pain but does have a little bleeding.

## 2011-10-13 NOTE — Plan of Care (Signed)
Pt is in the lab having her blood drawn when called to triage.

## 2011-10-15 NOTE — ED Provider Notes (Signed)
Attestation of Attending Supervision of Advanced Practitioner: Evaluation and management procedures were performed by the PA/NP/CNM/OB Fellow under my supervision/collaboration. Chart reviewed and agree with management and plan.  Jaynie Collins A M.D. 10/15/2011 5:27 PM

## 2011-10-20 ENCOUNTER — Encounter (HOSPITAL_COMMUNITY): Payer: Self-pay

## 2011-10-20 ENCOUNTER — Inpatient Hospital Stay (HOSPITAL_COMMUNITY)
Admission: AD | Admit: 2011-10-20 | Discharge: 2011-10-20 | Disposition: A | Discharge status: Home / Self Care | Payer: Self-pay | Source: Ambulatory Visit | Attending: Obstetrics & Gynecology | Admitting: Obstetrics & Gynecology

## 2011-10-20 DIAGNOSIS — O009 Unspecified ectopic pregnancy without intrauterine pregnancy: Secondary | ICD-10-CM

## 2011-10-20 DIAGNOSIS — O00109 Unspecified tubal pregnancy without intrauterine pregnancy: Secondary | ICD-10-CM | POA: Insufficient documentation

## 2011-10-20 NOTE — Progress Notes (Signed)
Pt here for repeat bhcg, was given 2nd MTX dose, denies pain, still bleeding lightly.

## 2011-10-20 NOTE — ED Provider Notes (Signed)
History   Pt presents today for repeat B-quant s/p 2nd dose of methotrexate. Pt states she is doing well and has no complaints. She denies abd pain and is only having minimal bleeding. She denies fever or any other sx at this time.  Chief Complaint  Patient presents with  . Labs Only   HPI  OB History    Grav Para Term Preterm Abortions TAB SAB Ect Mult Living   5 2 2  0 3 1 1 1  0 2      Past Medical History  Diagnosis Date  . Abnormal Pap smear 1992    Leep, biopsy  . Anxiety   . Cervical dysplasia     level 3  . Depression     Adm. for Suicidal Ideation and Drug use in 2003    Past Surgical History  Procedure Date  . No past surgeries     No family history on file.  History  Substance Use Topics  . Smoking status: Current Everyday Smoker -- 1.0 packs/day for 26 years    Types: Cigarettes  . Smokeless tobacco: Not on file   Comment: pt states will quit b/c she is pregnant  . Alcohol Use: No    Allergies:  Allergies  Allergen Reactions  . Morphine And Related Nausea And Vomiting    Prescriptions prior to admission  Medication Sig Dispense Refill  . albuterol (PROVENTIL HFA;VENTOLIN HFA) 108 (90 BASE) MCG/ACT inhaler Inhale 2 puffs into the lungs every 4 (four) hours as needed for wheezing or shortness of breath.  1 Inhaler  0  . amoxicillin-clavulanate (AUGMENTIN) 875-125 MG per tablet Take 1 tablet by mouth 2 (two) times daily.  14 tablet  0  . bisacodyl (EX-LAX ULTRA) 5 MG EC tablet Take 5 mg by mouth daily as needed.        Marland Kitchen guaiFENesin (MUCINEX) 600 MG 12 hr tablet Take 1,200 mg by mouth 2 (two) times daily. Patient was using this medication for congestion.       . hydrocortisone 0.5 % cream Apply 1 application topically 2 (two) times daily. Patient is using this medication for a bug bite.       . Pseudoeph-Doxylamine-DM-APAP (NYQUIL) 60-7.05-19-999 MG/30ML LIQD Take 5 mLs by mouth daily as needed. Patient is using this medication for cold symptoms.          Review of Systems  Constitutional: Negative for fever.  Cardiovascular: Negative for chest pain.  Gastrointestinal: Negative for nausea, vomiting, abdominal pain, diarrhea and constipation.  Genitourinary: Negative for dysuria, urgency, frequency and hematuria.  Neurological: Negative for dizziness and headaches.  Psychiatric/Behavioral: Negative for depression and suicidal ideas.   Physical Exam   Blood pressure 122/75, pulse 78, temperature 97.9 F (36.6 C), temperature source Oral, resp. rate 16, height 5\' 3"  (1.6 m), weight 147 lb 4 oz (66.792 kg), last menstrual period 08/05/2011, unknown if currently breastfeeding.  Physical Exam  Nursing note and vitals reviewed. Constitutional: She is oriented to person, place, and time. She appears well-developed and well-nourished. No distress.  HENT:  Head: Normocephalic and atraumatic.  Eyes: EOM are normal. Pupils are equal, round, and reactive to light.  GI: Soft. She exhibits no distension. There is no tenderness. There is no rebound and no guarding.  Neurological: She is alert and oriented to person, place, and time.  Skin: Skin is warm and dry. She is not diaphoretic.  Psychiatric: She has a normal mood and affect. Her behavior is normal. Judgment and thought content  normal.    MAU Course  Procedures  Results for orders placed during the hospital encounter of 10/20/11 (from the past 24 hour(s))  HCG, QUANTITATIVE, PREGNANCY     Status: Abnormal   Collection Time   10/20/11  8:38 AM      Component Value Range   hCG, Beta Chain, Quant, S 125 (*) <5 (mIU/mL)     Assessment and Plan  Ectopic preg: pt has had an appropriate drop in her B-quant. She will return in 1wk for repeat B-quant. Discussed diet, activity, risks, and precautions.  Clinton Gallant. Rice III, DrHSc, MPAS, PA-C  10/20/2011, 9:56 AM   Henrietta Hoover, PA 10/20/11 1000

## 2011-10-20 NOTE — ED Notes (Signed)
Dr. Dimple Casey spoke with pt in triage with finalized lab results, then d/c'd pt who voiced understanding of f/u appts, reasons to return sooner.

## 2011-10-27 ENCOUNTER — Inpatient Hospital Stay (HOSPITAL_COMMUNITY)
Admission: AD | Admit: 2011-10-27 | Discharge: 2011-10-27 | Disposition: A | Discharge status: Home / Self Care | Payer: Self-pay | Source: Ambulatory Visit | Attending: Obstetrics & Gynecology | Admitting: Obstetrics & Gynecology

## 2011-10-27 ENCOUNTER — Encounter (HOSPITAL_COMMUNITY): Payer: Self-pay | Admitting: *Deleted

## 2011-10-27 DIAGNOSIS — O00109 Unspecified tubal pregnancy without intrauterine pregnancy: Secondary | ICD-10-CM | POA: Insufficient documentation

## 2011-10-27 HISTORY — DX: Urinary tract infection, site not specified: N39.0

## 2011-10-27 HISTORY — DX: Acute parametritis and pelvic cellulitis: N73.0

## 2011-10-27 LAB — HCG, QUANTITATIVE, PREGNANCY: hCG, Beta Chain, Quant, S: 7 m[IU]/mL — ABNORMAL HIGH (ref <5)

## 2011-10-27 NOTE — Progress Notes (Signed)
Pt here post MTX.  Received 2 doses. Doing much better, small amt of d/c- bleeding continues.

## 2011-10-27 NOTE — ED Provider Notes (Signed)
History     Chief Complaint  Patient presents with  . Follow-up   HPI 40 y.o. who is s/p Methotrexate for ectopic pregnancy presents for followup quant HCG.  Feels well and has a small amount of spotting. No fever or pain.    Past Medical History  Diagnosis Date  . Abnormal Pap smear 1992    Leep, biopsy  . Cervical dysplasia     level 3  . Urinary tract infection   . PID (acute pelvic inflammatory disease)     Past Surgical History  Procedure Date  . Dilation and curettage of uterus   . Induced abortion   . Leep     Family History  Problem Relation Age of Onset  . Heart disease Father   . Cancer Maternal Grandmother   . Heart disease Maternal Grandmother     History  Substance Use Topics  . Smoking status: Current Everyday Smoker -- 1.0 packs/day for 26 years    Types: Cigarettes  . Smokeless tobacco: Never Used   Comment: pt states will quit b/c she is pregnant  . Alcohol Use: No    Allergies:  Allergies  Allergen Reactions  . Morphine And Related Nausea And Vomiting    Prescriptions prior to admission  Medication Sig Dispense Refill  . albuterol (PROVENTIL HFA;VENTOLIN HFA) 108 (90 BASE) MCG/ACT inhaler Inhale 2 puffs into the lungs every 4 (four) hours as needed for wheezing or shortness of breath.  1 Inhaler  0  . bisacodyl (EX-LAX ULTRA) 5 MG EC tablet Take 5 mg by mouth daily as needed.        Marland Kitchen guaiFENesin (MUCINEX) 600 MG 12 hr tablet Take 1,200 mg by mouth 2 (two) times daily. Patient was using this medication for congestion.       . hydrocortisone 0.5 % cream Apply 1 application topically 2 (two) times daily. Patient is using this medication for a bug bite.       . Pseudoeph-Doxylamine-DM-APAP (NYQUIL) 60-7.05-19-999 MG/30ML LIQD Take 5 mLs by mouth daily as needed. Patient is using this medication for cold symptoms.         ROS See above  Physical Exam   Blood pressure 121/65, pulse 89, temperature 98.7 F (37.1 C), temperature source Oral,  resp. rate 18, last menstrual period 08/05/2011, SpO2 95.00%, unknown if currently breastfeeding.  Physical Exam  Constitutional: She is oriented to person, place, and time. She appears well-developed and well-nourished.  HENT:  Head: Normocephalic.  Cardiovascular: Normal rate.   Respiratory: Effort normal.  GI: Soft. She exhibits no distension. There is no tenderness.  Musculoskeletal: Normal range of motion.  Neurological: She is alert and oriented to person, place, and time.  Skin: Skin is warm and dry.  Quant 7  MAU Course  Procedures  Assessment and Plan  A:  S/P Methotrexate       Appropriately declining Quants P:  Discussed with Dr Macon Large      Do one more next week then followup in clinic  [facility] 10/27/2011, 10:06 AM

## 2011-10-27 NOTE — ED Provider Notes (Signed)
Attestation of Attending Supervision of Advanced Practitioner: Evaluation and management procedures were performed by the PA/NP/CNM/OB Fellow under my supervision/collaboration. Chart reviewed, and agree with management and plan.  Jaynie Collins A M.D. 10/27/2011 12:41 PM

## 2011-11-10 ENCOUNTER — Ambulatory Visit (HOSPITAL_COMMUNITY): Payer: Self-pay

## 2011-12-07 ENCOUNTER — Encounter: Payer: Self-pay | Admitting: Obstetrics and Gynecology

## 2011-12-28 ENCOUNTER — Encounter: Payer: Self-pay | Admitting: Advanced Practice Midwife

## 2013-02-28 ENCOUNTER — Emergency Department (HOSPITAL_COMMUNITY)
Admission: EM | Admit: 2013-02-28 | Discharge: 2013-02-28 | Disposition: A | Discharge status: Rehabilitation Facility | Payer: Self-pay | Attending: Emergency Medicine | Admitting: Emergency Medicine

## 2013-02-28 ENCOUNTER — Encounter (HOSPITAL_COMMUNITY): Payer: Self-pay | Admitting: Emergency Medicine

## 2013-02-28 DIAGNOSIS — F1910 Other psychoactive substance abuse, uncomplicated: Secondary | ICD-10-CM

## 2013-02-28 DIAGNOSIS — F1310 Sedative, hypnotic or anxiolytic abuse, uncomplicated: Secondary | ICD-10-CM | POA: Insufficient documentation

## 2013-02-28 DIAGNOSIS — F17200 Nicotine dependence, unspecified, uncomplicated: Secondary | ICD-10-CM | POA: Insufficient documentation

## 2013-02-28 DIAGNOSIS — F1410 Cocaine abuse, uncomplicated: Secondary | ICD-10-CM | POA: Insufficient documentation

## 2013-02-28 DIAGNOSIS — F1010 Alcohol abuse, uncomplicated: Secondary | ICD-10-CM | POA: Insufficient documentation

## 2013-02-28 DIAGNOSIS — Z8742 Personal history of other diseases of the female genital tract: Secondary | ICD-10-CM | POA: Insufficient documentation

## 2013-02-28 DIAGNOSIS — F1210 Cannabis abuse, uncomplicated: Secondary | ICD-10-CM | POA: Insufficient documentation

## 2013-02-28 DIAGNOSIS — Z3202 Encounter for pregnancy test, result negative: Secondary | ICD-10-CM | POA: Insufficient documentation

## 2013-02-28 DIAGNOSIS — Z87440 Personal history of urinary (tract) infections: Secondary | ICD-10-CM | POA: Insufficient documentation

## 2013-02-28 LAB — COMPREHENSIVE METABOLIC PANEL
ALT: 60 U/L — ABNORMAL HIGH (ref 0–35)
AST: 44 U/L — ABNORMAL HIGH (ref 0–37)
Albumin: 3.6 g/dL (ref 3.5–5.2)
CO2: 24 mEq/L (ref 19–32)
Calcium: 9 mg/dL (ref 8.4–10.5)
Chloride: 103 mEq/L (ref 96–112)
Creatinine, Ser: 0.89 mg/dL (ref 0.50–1.10)
GFR calc non Af Amer: 79 mL/min — ABNORMAL LOW (ref >90)
Sodium: 138 mEq/L (ref 135–145)

## 2013-02-28 LAB — SALICYLATE LEVEL: Salicylate Lvl: <2 mg/dL — ABNORMAL LOW (ref 2.8–20.0)

## 2013-02-28 LAB — POCT PREGNANCY, URINE: Preg Test, Ur: NEGATIVE

## 2013-02-28 LAB — RAPID URINE DRUG SCREEN, HOSP PERFORMED
Barbiturates: NOT DETECTED
Cocaine: POSITIVE — AB
Opiates: NOT DETECTED

## 2013-02-28 LAB — CBC
MCH: 28.8 pg (ref 26.0–34.0)
MCV: 87.2 fL (ref 78.0–100.0)
Platelets: 276 10*3/uL (ref 150–400)
RBC: 4.52 MIL/uL (ref 3.87–5.11)
RDW: 13.6 % (ref 11.5–15.5)
WBC: 12.2 10*3/uL — ABNORMAL HIGH (ref 4.0–10.5)

## 2013-02-28 MED ORDER — ZOLPIDEM TARTRATE 5 MG PO TABS: 5.0000 mg | ORAL_TABLET | Freq: Every evening | ORAL | Status: DC | PRN

## 2013-02-28 MED ORDER — ONDANSETRON HCL 4 MG PO TABS: 4.0000 mg | ORAL_TABLET | Freq: Three times a day (TID) | ORAL | Status: DC | PRN

## 2013-02-28 MED ORDER — LORAZEPAM 1 MG PO TABS: 1.0000 mg | ORAL_TABLET | Freq: Three times a day (TID) | ORAL | Status: DC | PRN

## 2013-02-28 MED ORDER — ALUM & MAG HYDROXIDE-SIMETH 200-200-20 MG/5ML PO SUSP: 30.0000 mL | ORAL | Status: DC | PRN

## 2013-02-28 MED ORDER — NICOTINE 21 MG/24HR TD PT24: 21.0000 mg | MEDICATED_PATCH | Freq: Every day | TRANSDERMAL | Status: DC

## 2013-02-28 MED ORDER — ACETAMINOPHEN 325 MG PO TABS: 650.0000 mg | ORAL_TABLET | ORAL | Status: DC | PRN

## 2013-02-28 MED ORDER — IBUPROFEN 600 MG PO TABS
600.0000 mg | ORAL_TABLET | Freq: Three times a day (TID) | ORAL | Status: DC | PRN
  Administered 2013-02-28: 600 mg via ORAL
  Filled 2013-02-28: qty 1

## 2013-02-28 NOTE — Clinical Social Work Note (Signed)
CSW called [facility] at [phone] who stated that our pt was not a pt of theirs.  Pt may have just called for information on tx/detox.    Vickii Penna, LCSWA [phone]  Clinical Social Work

## 2013-02-28 NOTE — ED Notes (Signed)
States that she was sent for medical clearance from [facility]. States that she wants help with ETOH, marijuana, and cocaine. States that she is tried of waking up sick. Last used ETOH 1 hr ago (2-3 beers), Cocaine last pm, marijuana 2-3 days ago.

## 2013-02-28 NOTE — ED Notes (Signed)
Requested PA-C screen pt.

## 2013-02-28 NOTE — BH Assessment (Signed)
Assessment Note   Kristine Mccarthy is an 42 y.o. female. Patient with past medical history of polysubstance abuse presents to the ED for detox. Patient would like to detox from Alcohol. She also regularly uses cocaine and THC. Patient occasionally uses Benzodiazepines (xanax and klonopin).  Patient reports using these substances frequently and is "tired of waking up sick everyday". Patient reports withdrawals symptoms of nausea, vomiting, tremors, tingling, hot/cold flashes, anxiety, etc.  Patient reports being sober previously and would like help. Says that her alcohol and drug use has caused her to loss her job, housing, and family. Patient is motivated to reconnect with her family stating, "My daughter will not let me see my grandchildren because of my substance abuse".  She denies SI/HI. Previous history of cutting, no current issues. Patient admitting to mild depression as she feels guilty and hopeless. No AVH's. Patient hospitalized in the past ("over 15 yrs ago) at [facility], [facility], and [facility].     Axis I: Polystubstance Abuse Axis II: Deferred Axis III:  Past Medical History  Diagnosis Date  . Abnormal Pap smear 1992    Leep, biopsy  . Cervical dysplasia     level 3  . Urinary tract infection   . PID (acute pelvic inflammatory disease)    Axis IV: economic problems, housing problems, other psychosocial or environmental problems, problems related to social environment, problems with access to health care services and problems with primary support group Axis V: 31-40 impairment in reality testing  Past Medical History:  Past Medical History  Diagnosis Date  . Abnormal Pap smear 1992    Leep, biopsy  . Cervical dysplasia     level 3  . Urinary tract infection   . PID (acute pelvic inflammatory disease)     Past Surgical History  Procedure Laterality Date  . Dilation and curettage of uterus    . Induced abortion    . Leep      Family History:  Family History  Problem  Relation Age of Onset  . Heart disease Father   . Cancer Maternal Grandmother   . Heart disease Maternal Grandmother     Social History:  reports that she has been smoking Cigarettes.  She has a 26 pack-year smoking history. She has never used smokeless tobacco. She reports that  drinks alcohol. She reports that she uses illicit drugs (Cocaine and Marijuana).  Additional Social History:  Alcohol / Drug Use Pain Medications: SEE MAR Prescriptions: SEE MAR Over the Counter: SEE MAR History of alcohol / drug use?: Yes Longest period of sobriety (when/how long): "few days" Negative Consequences of Use: Financial;Legal;Personal relationships;Work / Programmer, multimedia Withdrawal Symptoms: Agitation;Blackouts;Sweats;Patient aware of relationship between substance abuse and physical/medical complications;Nausea / Vomiting;Weakness;Irritability;Fever / Chills Substance #1 Name of Substance 1: Alcohol  1 - Age of First Use: 42 yrs old 1 - Amount (size/oz): 1 case or more daily 1 - Frequency: daily  1 - Duration: 3-4 yrs  1 - Last Use / Amount: 02/28/2013; "this morning"; 1 beer Substance #2 Name of Substance 2: Cocaine  2 - Age of First Use: 42 yrs old  2 - Amount (size/oz): $20-$100 per use 2 - Frequency: 3-4x's per week  2 - Duration: 7 yrs 2 - Last Use / Amount: 02/27/2013; "last night" Substance #3 Name of Substance 3: THC 3 - Age of First Use: 13 yrs 3 - Amount (size/oz): 1 joint per use 3 - Frequency: 3-4x's per month 3 - Duration: 7 yrs 3 -  Last Use / Amount: 3 days ago Substance #4 Name of Substance 4: Benzodiazepines  4 - Age of First Use: 42 yrs old 4 - Amount (size/oz): 3-7 pills 4 - Frequency: 3-4x's per moonth 4 - Duration: on-going 4 - Last Use / Amount: 1 month ago  CIWA: CIWA-Ar BP: 123/73 mmHg Pulse Rate: 96 COWS:    Allergies:  Allergies  Allergen Reactions  . Morphine And Related Nausea And Vomiting    Home Medications:  (Not in a hospital admission)  OB/GYN  Status:  Patient's last menstrual period was 02/08/2013.  General Assessment Data Location of Assessment: [facility] Living Arrangements: Other (Comment);Non-relatives/Friends (was living/caring for elderly man (kicked out) x1 mo ago) Can pt return to current living arrangement?: No Admission Status: Voluntary Is patient capable of signing voluntary admission?: Yes Transfer from: Acute Hospital Referral Source: Self/Family/Friend  Education Status Is patient currently in school?: No  Risk to self Suicidal Ideation: No Suicidal Intent: No Is patient at risk for suicide?: No Suicidal Plan?: No Access to Means: No What has been your use of drugs/alcohol within the last 12 months?:  (alcohol, cocaine, THC, Benzo's) Previous Attempts/Gestures: Yes (yes-attempted to cut her wrist in the past ) How many times?:  (several-cutting) Other Self Harm Risks:  (n/a) Triggers for Past Attempts: Other (Comment) (Pt sts, "I don't know..I don't remember"; intoxicated) Intentional Self Injurious Behavior: Cutting Comment - Self Injurious Behavior:  (yes-cutting (self mutilation)) Family Suicide History: No Recent stressful life event(s): Other (Comment);Turmoil (Comment);Loss (Comment);Job Loss;Conflict (Comment) (pt loss job and housing due to ETOH, disconnect from family) Persecutory voices/beliefs?: No Depression: Yes Depression Symptoms: Feeling angry/irritable;Feeling worthless/self pity;Loss of interest in usual pleasures;Isolating;Fatigue Substance abuse history and/or treatment for substance abuse?: No Suicide prevention information given to non-admitted patients: Not applicable  Risk to Others Homicidal Ideation: No Thoughts of Harm to Others: No Current Homicidal Intent: No Current Homicidal Plan: No Access to Homicidal Means: No Identified Victim:  (n/a) History of harm to others?: No Assessment of Violence: None Noted Violent Behavior Description:  (patient is calm and  cooperative) Does patient have access to weapons?: No Criminal Charges Pending?: No ("I received my 2nd DUI iin 1999") Does patient have a court date: No  Psychosis Hallucinations: None noted Delusions: None noted  Mental Status Report Appear/Hygiene: Bizarre Eye Contact: Good Motor Activity: Freedom of movement Speech: Logical/coherent Level of Consciousness: Alert Mood: Depressed Affect: Appropriate to circumstance Anxiety Level: None Thought Processes: Coherent;Relevant Judgement: Unimpaired Orientation: Person;Place;Time;Situation Obsessive Compulsive Thoughts/Behaviors: None  Cognitive Functioning Concentration: Normal Memory: Recent Intact;Remote Intact IQ: Average Insight: Good Impulse Control: Fair Appetite: Poor Weight Loss:  (none reported) Weight Gain:  (none reported) Sleep: Decreased Total Hours of Sleep:  (4-5 hours per night) Vegetative Symptoms: None  ADLScreening [facility] Assessment Services) Patient's cognitive ability adequate to safely complete daily activities?: Yes Patient able to express need for assistance with ADLs?: Yes Independently performs ADLs?: Yes (appropriate for developmental age)  Abuse/Neglect [facility]) Physical Abuse: Denies Verbal Abuse: Denies Sexual Abuse: Denies  Prior Inpatient Therapy Prior Inpatient Therapy: Yes Prior Therapy Dates:  (14-15 yrs ago received substance abuse treatment) Prior Therapy Facilty/Provider(s):  [facility], [facility], and [facility]) Reason for Treatment:  (substance abuse treatment)  Prior Outpatient Therapy Prior Outpatient Therapy: No Prior Therapy Dates:  (n/a) Prior Therapy Facilty/Provider(s):  (n/a) Reason for Treatment:  (n/a)  ADL Screening (condition at time of admission) Patient's cognitive ability adequate to safely complete daily activities?: Yes Patient able to express need for  assistance with ADLs?: Yes Independently performs ADLs?: Yes (appropriate for developmental age) Weakness of Legs:  None Weakness of Arms/Hands: None  Home Assistive Devices/Equipment Home Assistive Devices/Equipment: None  Therapy Consults (therapy consults require a physician order) PT Evaluation Needed: No OT Evalulation Needed: No SLP Evaluation Needed: No Abuse/Neglect Assessment (Assessment to be complete while patient is alone) Physical Abuse: Denies Verbal Abuse: Denies Sexual Abuse: Denies Exploitation of patient/patient's resources: Denies Self-Neglect: Denies Values / Beliefs Cultural Requests During Hospitalization: None Spiritual Requests During Hospitalization: None Consults Spiritual Care Consult Needed: No Social Work Consult Needed: No Merchant navy officer (For Healthcare) Advance Directive: Patient does not have advance directive Nutrition Screen- [facility] Patient's home diet: Regular  Additional Information 1:1 In Past 12 Months?: No CIRT Risk: No Elopement Risk: No Does patient have medical clearance?: Yes     Disposition:  Disposition Initial Assessment Completed: Yes Disposition of Patient: Other dispositions;Referred to (Pt referred to [facility] for detox.) Other disposition(s): Referred to outside facility Patient referred to: [facility];Other (Comment) ([facility])  On Site Evaluation by:   Reviewed with Physician:     Melynda Ripple [facility] 02/28/2013 2:56 PM

## 2013-02-28 NOTE — ED Notes (Addendum)
Report given to Pat at [facility].

## 2013-02-28 NOTE — ED Notes (Signed)
Patient discharge to home with written and verbal orders with steady gait with [facility] staff with coat and shoes on. Respirations equal and unlabored. Skin warm and dry. No acute distress noted. Belongings sent with patient.

## 2013-02-28 NOTE — ED Provider Notes (Signed)
History     CSN: [id number]  Arrival date & time 02/28/13  1110   First MD Initiated Contact with Patient 02/28/13 1237      Chief Complaint  Patient presents with  . Medical Clearance    (Consider location/radiation/quality/duration/timing/severity/associated sxs/prior treatment) HPI Comments: Patient is a 42 year old female with a past medical history of polysubstance abuse who presents to the ED for detox. Patient would like to detox from cocaine, alcohol, and marijuana. Patient reports using these substances daily and is "tired of waking up sick everyday." Patient reports being sober previously and would like help. She denies SI/HI.    Past Medical History  Diagnosis Date  . Abnormal Pap smear 1992    Leep, biopsy  . Cervical dysplasia     level 3  . Urinary tract infection   . PID (acute pelvic inflammatory disease)     Past Surgical History  Procedure Laterality Date  . Dilation and curettage of uterus    . Induced abortion    . Leep      Family History  Problem Relation Age of Onset  . Heart disease Father   . Cancer Maternal Grandmother   . Heart disease Maternal Grandmother     History  Substance Use Topics  . Smoking status: Current Every Day Smoker -- 1.00 packs/day for 26 years    Types: Cigarettes  . Smokeless tobacco: Never Used     Comment: pt states will quit b/c she is pregnant  . Alcohol Use: Yes    OB History   Grav Para Term Preterm Abortions TAB SAB Ect Mult Living   5 2 2  0 3 1 1 1  0 2      Review of Systems  Psychiatric/Behavioral:       Polysubstance abuse  All other systems reviewed and are negative.    Allergies  Morphine and related  Home Medications  No current outpatient prescriptions on file.  BP 123/73  Pulse 96  Temp(Src) 98.3 F (36.8 C) (Oral)  Resp 19  SpO2 100%  LMP 02/08/2013  Physical Exam  Nursing note and vitals reviewed. Constitutional: She is oriented to person, place, and time. She appears  well-developed and well-nourished. No distress.  HENT:  Head: Normocephalic and atraumatic.  Eyes: Conjunctivae are normal.  Neck: Normal range of motion. Neck supple.  Cardiovascular: Normal rate and regular rhythm.  Exam reveals no gallop and no friction rub.   No murmur heard. Pulmonary/Chest: Effort normal and breath sounds normal. She has no wheezes. She has no rales. She exhibits no tenderness.  Abdominal: Soft. There is no tenderness.  Musculoskeletal: Normal range of motion.  Neurological: She is alert and oriented to person, place, and time.  Speech is goal-oriented. Moves limbs without ataxia.   Skin: Skin is warm and dry.  Psychiatric:  Depressed mood.     ED Course  Procedures (including critical care time)  Labs Reviewed  CBC - Abnormal; Notable for the following:    WBC 12.2 (*)    All other components within normal limits  COMPREHENSIVE METABOLIC PANEL - Abnormal; Notable for the following:    AST 44 (*)    ALT 60 (*)    Total Bilirubin 0.2 (*)    GFR calc non Af Amer 79 (*)    All other components within normal limits  ETHANOL - Abnormal; Notable for the following:    Alcohol, Ethyl (B) 42 (*)    All other components within normal limits  SALICYLATE LEVEL - Abnormal; Notable for the following:    Salicylate Lvl <2.0 (*)    All other components within normal limits  URINE RAPID DRUG SCREEN (HOSP PERFORMED) - Abnormal; Notable for the following:    Cocaine POSITIVE (*)    All other components within normal limits  ACETAMINOPHEN LEVEL  POCT PREGNANCY, URINE   No results found.   1. Substance abuse       MDM  12:44 PM Patient medically cleared.         Emilia Beck, [place] 03/01/13 [phone]

## 2013-02-28 NOTE — Progress Notes (Signed)
Pt accepted to [facility] for detox. [facility]. Transportation to be provided at 1130 pm.   .Catha Gosselin, LCSWA  [phone] .02/28/2013 10:22

## 2013-02-28 NOTE — ED Notes (Signed)
Attempted to call report nurse unavailable at this time at [facility].

## 2013-03-01 NOTE — ED Provider Notes (Signed)
Medical screening examination/treatment/procedure(s) were performed by non-physician practitioner and as supervising physician I was immediately available for consultation/collaboration.  Gilda Crease, MD 03/01/13 [phone]

## 2014-03-26 LAB — HM HIV SCREENING LAB: HM HIV Screening: NEGATIVE

## 2014-08-22 ENCOUNTER — Encounter (HOSPITAL_COMMUNITY): Payer: Self-pay | Admitting: Emergency Medicine

## 2014-08-22 ENCOUNTER — Emergency Department (HOSPITAL_COMMUNITY)
Admission: EM | Admit: 2014-08-22 | Discharge: 2014-08-22 | Disposition: A | Discharge status: Home / Self Care | Payer: Self-pay | Source: Home / Self Care | Attending: Emergency Medicine | Admitting: Emergency Medicine

## 2014-08-22 DIAGNOSIS — R5381 Other malaise: Secondary | ICD-10-CM

## 2014-08-22 DIAGNOSIS — R5383 Other fatigue: Secondary | ICD-10-CM

## 2014-08-22 HISTORY — DX: Other psychoactive substance abuse, uncomplicated: F19.10

## 2014-08-22 HISTORY — DX: Unspecified osteoarthritis, unspecified site: M19.90

## 2014-08-22 LAB — POCT I-STAT, CHEM 8
BUN: 8 mg/dL (ref 6–23)
CALCIUM ION: 1.21 mmol/L (ref 1.12–1.23)
CREATININE: 0.8 mg/dL (ref 0.50–1.10)
Chloride: 105 mEq/L (ref 96–112)
GLUCOSE: 80 mg/dL (ref 70–99)
HCT: 50 % — ABNORMAL HIGH (ref 36.0–46.0)
HEMOGLOBIN: 17 g/dL — AB (ref 12.0–15.0)
Potassium: 4 mEq/L (ref 3.7–5.3)
SODIUM: 139 meq/L (ref 137–147)
TCO2: 26 mmol/L (ref 0–100)

## 2014-08-22 LAB — POCT URINALYSIS DIP (DEVICE)
Bilirubin Urine: NEGATIVE
Glucose, UA: NEGATIVE mg/dL
HGB URINE DIPSTICK: NEGATIVE
KETONES UR: NEGATIVE mg/dL
Leukocytes, UA: NEGATIVE
Nitrite: NEGATIVE
PH: 6.5 (ref 5.0–8.0)
PROTEIN: NEGATIVE mg/dL
SPECIFIC GRAVITY, URINE: 1.01 (ref 1.005–1.030)
UROBILINOGEN UA: 0.2 mg/dL (ref 0.0–1.0)

## 2014-08-22 LAB — POCT PREGNANCY, URINE: Preg Test, Ur: NEGATIVE

## 2014-08-22 NOTE — Discharge Instructions (Signed)
Your exam, vital signs and labs were all normal. Should your symptoms persist despite regular meals, good hydration and adequate rest, please follow up with the primary care doctor of your choice. Should symptoms become suddenly worse or severe, please report to your nearest ER for assistance.   Fatigue Fatigue is a feeling of tiredness, lack of energy, lack of motivation, or feeling tired all the time. Having enough rest, good nutrition, and reducing stress will normally reduce fatigue. Consult your caregiver if it persists. The nature of your fatigue will help your caregiver to find out its cause. The treatment is based on the cause.  CAUSES  There are many causes for fatigue. Most of the time, fatigue can be traced to one or more of your habits or routines. Most causes fit into one or more of three general areas. They are: Lifestyle problems  Sleep disturbances.  Overwork.  Physical exertion.  Unhealthy habits.  Poor eating habits or eating disorders.  Alcohol and/or drug use .  Lack of proper nutrition (malnutrition). Psychological problems  Stress and/or anxiety problems.  Depression.  Grief.  Boredom. Medical Problems or Conditions  Anemia.  Pregnancy.  Thyroid gland problems.  Recovery from major surgery.  Continuous pain.  Emphysema or asthma that is not well controlled  Allergic conditions.  Diabetes.  Infections (such as mononucleosis).  Obesity.  Sleep disorders, such as sleep apnea.  Heart failure or other heart-related problems.  Cancer.  Kidney disease.  Liver disease.  Effects of certain medicines such as antihistamines, cough and cold remedies, prescription pain medicines, heart and blood pressure medicines, drugs used for treatment of cancer, and some antidepressants. SYMPTOMS  The symptoms of fatigue include:   Lack of energy.  Lack of drive (motivation).  Drowsiness.  Feeling of indifference to the surroundings. DIAGNOSIS    The details of how you feel help guide your caregiver in finding out what is causing the fatigue. You will be asked about your present and past health condition. It is important to review all medicines that you take, including prescription and non-prescription items. A thorough exam will be done. You will be questioned about your feelings, habits, and normal lifestyle. Your caregiver may suggest blood tests, urine tests, or other tests to look for common medical causes of fatigue.  TREATMENT  Fatigue is treated by correcting the underlying cause. For example, if you have continuous pain or depression, treating these causes will improve how you feel. Similarly, adjusting the dose of certain medicines will help in reducing fatigue.  HOME CARE INSTRUCTIONS   Try to get the required amount of good sleep every night.  Eat a healthy and nutritious diet, and drink enough water throughout the day.  Practice ways of relaxing (including yoga or meditation).  Exercise regularly.  Make plans to change situations that cause stress. Act on those plans so that stresses decrease over time. Keep your work and personal routine reasonable.  Avoid street drugs and minimize use of alcohol.  Start taking a daily multivitamin after consulting your caregiver. SEEK MEDICAL CARE IF:   You have persistent tiredness, which cannot be accounted for.  You have fever.  You have unintentional weight loss.  You have headaches.  You have disturbed sleep throughout the night.  You are feeling sad.  You have constipation.  You have dry skin.  You have gained weight.  You are taking any new or different medicines that you suspect are causing fatigue.  You are unable to sleep at night.  You develop any unusual swelling of your legs or other parts of your body. SEEK IMMEDIATE MEDICAL CARE IF:   You are feeling confused.  Your vision is blurred.  You feel faint or pass out.  You develop severe  headache.  You develop severe abdominal, pelvic, or back pain.  You develop chest pain, shortness of breath, or an irregular or fast heartbeat.  You are unable to pass a normal amount of urine.  You develop abnormal bleeding such as bleeding from the rectum or you vomit blood.  You have thoughts about harming yourself or committing suicide.  You are worried that you might harm someone else. MAKE SURE YOU:   Understand these instructions.  Will watch your condition.  Will get help right away if you are not doing well or get worse. Document Released: 10/04/2007 Document Revised: 02/29/2012 Document Reviewed: 04/10/2014 [facility] Patient Information 2015 [place], [place]. This information is not intended to replace advice given to you by your health care provider. Make sure you discuss any questions you have with your health care provider.  Weakness Weakness is a lack of strength. It may be felt all over the body (generalized) or in one specific part of the body (focal). Some causes of weakness can be serious. You may need further medical evaluation, especially if you are elderly or you have a history of immunosuppression (such as chemotherapy or HIV), kidney disease, heart disease, or diabetes. CAUSES  Weakness can be caused by many different things, including:  Infection.  Physical exhaustion.  Internal bleeding or other blood loss that results in a lack of red blood cells (anemia).  Dehydration. This cause is more common in elderly people.  Side effects or electrolyte abnormalities from medicines, such as pain medicines or sedatives.  Emotional distress, anxiety, or depression.  Circulation problems, especially severe peripheral arterial disease.  Heart disease, such as rapid atrial fibrillation, bradycardia, or heart failure.  Nervous system disorders, such as Guillain-Barr syndrome, multiple sclerosis, or stroke. DIAGNOSIS  To find the cause of your weakness, your  caregiver will take your history and perform a physical exam. Lab tests or X-rays may also be ordered, if needed. TREATMENT  Treatment of weakness depends on the cause of your symptoms and can vary greatly. HOME CARE INSTRUCTIONS   Rest as needed.  Eat a well-balanced diet.  Try to get some exercise every day.  Only take over-the-counter or prescription medicines as directed by your caregiver. SEEK MEDICAL CARE IF:   Your weakness seems to be getting worse or spreads to other parts of your body.  You develop new aches or pains. SEEK IMMEDIATE MEDICAL CARE IF:   You cannot perform your normal daily activities, such as getting dressed and feeding yourself.  You cannot walk up and down stairs, or you feel exhausted when you do so.  You have shortness of breath or chest pain.  You have difficulty moving parts of your body.  You have weakness in only one area of the body or on only one side of the body.  You have a fever.  You have trouble speaking or swallowing.  You cannot control your bladder or bowel movements.  You have black or bloody vomit or stools. MAKE SURE YOU:  Understand these instructions.  Will watch your condition.  Will get help right away if you are not doing well or get worse. Document Released: 12/07/2005 Document Revised: 06/07/2012 Document Reviewed: 02/05/2012 [facility] Patient Information 2015 [place], [place]. This information is not intended  to replace advice given to you by your health care provider. Make sure you discuss any questions you have with your health care provider. ° °

## 2014-08-22 NOTE — ED Provider Notes (Signed)
Medical screening examination/treatment/procedure(s) were performed by non-physician practitioner and as supervising physician I was immediately available for consultation/collaboration.  Starlina Lapre, M.D.  Irby Fails C Doreatha Offer, MD 08/22/14 1933 

## 2014-08-22 NOTE — ED Notes (Signed)
C/o not felt well since Monday w fatigue, fever, chills, no energy and BLACK STOOLS. Marland Kitchen Denies motrin use , denies ETOH, denies denies Pepto use

## 2014-08-22 NOTE — ED Provider Notes (Signed)
CSN: [id number]     Arrival date & time 08/22/14  1626 History   First MD Initiated Contact with Patient 08/22/14 1659     Chief Complaint  Patient presents with  . Fever   (Consider location/radiation/quality/duration/timing/severity/associated sxs/prior Treatment) HPI Comments: Patient reports subjective intermittent fever since [id number] with associated fatigue, black stools. Denies NSAID use, ETOH use or use of aspirin containing products. Denies abdominal pain, nausea, vomiting. Denies chest pain, pedal edema or dyspnea. No URI sx. No rash. No tick bites.  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 No recent travel.    Patient is a 43 y.o. female presenting with fever. The history is provided by the patient.  Fever Associated symptoms: chills   Associated symptoms: no diarrhea, no headaches, no nausea and no vomiting     Past Medical History  Diagnosis Date  . Abnormal Pap smear 1992    Leep, biopsy  . Cervical dysplasia     level 3  . Urinary tract infection   . PID (acute pelvic inflammatory disease)   . Arthritis   . Substance abuse    Past Surgical History  Procedure Laterality Date  . Dilation and curettage of uterus    . Induced abortion    . Leep     Family History  Problem Relation Age of Onset  . Heart disease Father   . Cancer Maternal Grandmother   . Heart disease Maternal Grandmother    History  Substance Use Topics  . Smoking status: Current Every Day Smoker -- 1.00 packs/day for 26 years    Types: Cigarettes  . Smokeless tobacco: Never Used     Comment: pt states will quit b/c she is pregnant  . Alcohol Use: Yes   OB History   Grav Para Term Preterm Abortions TAB SAB Ect Mult Living   5 2 2  0 3 1 1 1  0 2     Review of Systems  Constitutional: Positive for fever, chills and fatigue. Negative for appetite change and unexpected weight change.  HENT:  Negative.   Eyes: Negative.   Respiratory: Negative.   Cardiovascular: Negative.   Gastrointestinal: Positive for constipation. Negative for nausea, vomiting, abdominal pain, diarrhea, blood in stool, abdominal distention, anal bleeding and rectal pain.       +melena  Endocrine: Negative for polydipsia, polyphagia and polyuria.  Genitourinary: Negative.   Musculoskeletal: Negative.   Skin: Negative.   Neurological: Positive for weakness and light-headedness. Negative for dizziness, tremors, seizures, syncope, numbness and headaches.  Psychiatric/Behavioral: Negative.     Allergies  Morphine and related  Home Medications   Prior to Admission medications   Not on File   BP 113/72  Pulse 73  Temp(Src) 98 F (36.7 C) (Oral)  Resp 16  SpO2 98%  LMP 08/05/2014 Physical Exam  Nursing note and vitals reviewed. Constitutional: She is oriented to person, place, and time. She appears well-developed and well-nourished. No distress.  HENT:  Head: Normocephalic and atraumatic.  Mouth/Throat: Oropharynx is clear and moist.  Eyes: Conjunctivae are normal. No scleral icterus.  Neck: Neck supple.  Cardiovascular: Normal rate, regular rhythm and normal heart sounds.   Pulmonary/Chest: Effort normal and breath sounds normal. No respiratory distress. She has no wheezes.  Abdominal: Soft. Bowel sounds are normal. She exhibits no distension. There is no tenderness.  Genitourinary: Guaiac negative stool.  Musculoskeletal: Normal range of motion.  Lymphadenopathy:    She has no cervical adenopathy.  Neurological: She is oriented to person, place, and time.  Skin: Skin is warm and dry. No rash noted. No erythema.  Psychiatric: She has a normal mood and affect. Her behavior is normal.    ED Course  Procedures (including critical care time) Labs Review Labs Reviewed  POCT I-STAT, CHEM 8 - Abnormal; Notable for the following:    Hemoglobin 17.0 (*)    HCT 50.0 (*)    All other components  within normal limits  OCCULT BLOOD X 1 CARD TO LAB, STOOL  POCT PREGNANCY, URINE  POCT URINALYSIS DIP (DEVICE)    Imaging Review No results found.   MDM   1. Other fatigue    UPT negative UA normal I-Stat 8 normal with mild elevation of H/H.  Exam unremarkable and without melena. Vital signs normal. No clear explanation for subjective fever or fatigue.  Patient advised of lab results and advised to follow up with PCP if symptoms persist. Instructed to present at nearest ER if symptoms become suddenly worse or severe.     Brooklin Rieger, [place] 08/22/14 [phone]

## 2014-08-23 LAB — OCCULT BLOOD, POC DEVICE: FECAL OCCULT BLD: NEGATIVE

## 2014-10-22 ENCOUNTER — Encounter (HOSPITAL_COMMUNITY): Payer: Self-pay | Admitting: Emergency Medicine

## 2017-09-20 ENCOUNTER — Other Ambulatory Visit: Payer: Self-pay

## 2017-09-20 DIAGNOSIS — Z1231 Encounter for screening mammogram for malignant neoplasm of breast: Secondary | ICD-10-CM

## 2017-09-27 ENCOUNTER — Other Ambulatory Visit (HOSPITAL_COMMUNITY): Payer: Self-pay | Admitting: *Deleted

## 2017-09-27 DIAGNOSIS — N644 Mastodynia: Secondary | ICD-10-CM

## 2017-10-14 ENCOUNTER — Other Ambulatory Visit (HOSPITAL_COMMUNITY): Payer: Self-pay | Admitting: Obstetrics and Gynecology

## 2017-10-14 ENCOUNTER — Ambulatory Visit
Admission: RE | Admit: 2017-10-14 | Discharge: 2017-10-14 | Disposition: A | Discharge status: Home / Self Care | Payer: No Typology Code available for payment source | Source: Ambulatory Visit | Attending: Obstetrics and Gynecology | Admitting: Obstetrics and Gynecology

## 2017-10-14 ENCOUNTER — Ambulatory Visit (HOSPITAL_COMMUNITY)
Admission: RE | Admit: 2017-10-14 | Discharge: 2017-10-14 | Disposition: A | Discharge status: Home / Self Care | Payer: Self-pay | Source: Ambulatory Visit | Attending: Obstetrics and Gynecology | Admitting: Obstetrics and Gynecology

## 2017-10-14 ENCOUNTER — Encounter (HOSPITAL_COMMUNITY): Payer: Self-pay

## 2017-10-14 VITALS — BP 122/80 | Temp 98.6°F | Ht 63.5 in

## 2017-10-14 DIAGNOSIS — N644 Mastodynia: Secondary | ICD-10-CM

## 2017-10-14 DIAGNOSIS — N6489 Other specified disorders of breast: Secondary | ICD-10-CM

## 2017-10-14 DIAGNOSIS — N6452 Nipple discharge: Secondary | ICD-10-CM

## 2017-10-14 DIAGNOSIS — Z01419 Encounter for gynecological examination (general) (routine) without abnormal findings: Secondary | ICD-10-CM

## 2017-10-14 HISTORY — DX: Scoliosis, unspecified: M41.9

## 2017-10-14 NOTE — Progress Notes (Signed)
Complaints of bilateral lower breast pain x 1-2 months left breast and 1 week right breast. Patient states pain comes and goes rating between a 2-3 out of 10. Patient stated she has had bilateral greenish colored breast discharge for many years when expressed.  Pap Smear: Pap smear not completed today. Last Pap smear was in 2014 in [place] and normal. Per patient has a history of three abnormal Pap smears with the first being 26 years ago and the last one 23 years ago. Patient stated she had a LEEP completed 23 years ago after her last abnormal Pap smear. Patient stated all Pap smears have been normal since LEEP. No Pap smear results are in EPIC.  Physical exam: Breasts Breasts symmetrical. No skin abnormalities bilateral breasts. No nipple retraction bilateral breasts. Bilateral greenish/clear colored discharge that was more greenish colored within the right. Samples of bilateral breast discharge sent to cytology for evaluation. No lymphadenopathy. No lumps palpated bilateral breasts. Complaints of left lower breast tenderness on exam. Referred patient to the [facility] for a diagnostic mammogram and possible bilateral breast ultrasounds. Appointment scheduled for Thursday, October 14, 2017 at 1450.  Pelvic/Bimanual   Ext Genitalia No lesions, no swelling and no discharge observed on external genitalia.         Vagina Vagina pink and normal texture. No lesions or discharge observed in vagina.          Cervix Cervix is present. Cervix pink and of normal texture. No discharge observed.     Uterus Uterus is present and palpable. Uterus is retroverted, firm, and enlarged. Patient stated has a history of fibroids and is currently having AUB. Will refer to [facility] for follow-up.     Adnexae Bilateral ovaries present and palpable. No tenderness on palpation.          Rectovaginal No rectal exam completed today since patient had no  rectal complaints. No skin abnormalities observed on exam.    Smoking History: Patient is a current smoker. Discussed smoking cessation with patient. Referred patient to the [facility] Quitline and gave resources to free smoking cessation classes at [facility].  Patient Navigation: Patient education provided. Access to services provided for patient through [facility] program.

## 2017-10-14 NOTE — Addendum Note (Signed)
Encounter addended by: Loletta Parish, RN on: 10/14/2017  3:19 PM<BR>    Actions taken: Sign clinical note

## 2017-10-14 NOTE — Patient Instructions (Addendum)
Explained breast self awareness with [place]. Let patient know [facility] will cover Pap smears and HPV typing every 5 years unless has a history of abnormal Pap smears. Referred patient to the [facility] for a diagnostic mammogram and possible bilateral breast ultrasounds. Appointment scheduled for Thursday, October 14, 2017 at 1450. Let patient know will follow up with her within the next couple weeks with results of Pap smear and breast discharge by phone. Discussed smoking cessation with patient. Referred patient to the [facility] Quitline and gave resources to free smoking cessation classes at [facility]. Athelstan verbalized understanding.  Nela Bascom, Arvil Chaco, RN 2:50 PM

## 2017-10-15 ENCOUNTER — Encounter (HOSPITAL_COMMUNITY): Payer: Self-pay | Admitting: *Deleted

## 2017-10-18 LAB — CYTOLOGY - PAP
Diagnosis: NEGATIVE
Diagnosis: REACTIVE
HPV: NOT DETECTED

## 2017-10-20 ENCOUNTER — Telehealth (HOSPITAL_COMMUNITY): Payer: Self-pay | Admitting: *Deleted

## 2017-10-20 NOTE — Telephone Encounter (Signed)
Telephoned patient at home number and advised patient of negative pap smear results. HPV was negative. Next pap smear due in five years. Advised patient breast discharge was benign. Patient voiced understanding.

## 2017-10-21 ENCOUNTER — Other Ambulatory Visit (HOSPITAL_COMMUNITY): Payer: Self-pay | Admitting: Obstetrics and Gynecology

## 2017-10-21 ENCOUNTER — Ambulatory Visit
Admission: RE | Admit: 2017-10-21 | Discharge: 2017-10-21 | Disposition: A | Discharge status: Home / Self Care | Payer: No Typology Code available for payment source | Source: Ambulatory Visit | Attending: Obstetrics and Gynecology | Admitting: Obstetrics and Gynecology

## 2017-10-21 DIAGNOSIS — N6489 Other specified disorders of breast: Secondary | ICD-10-CM

## 2017-10-29 ENCOUNTER — Telehealth: Payer: Self-pay | Admitting: General Practice

## 2017-10-29 NOTE — Telephone Encounter (Signed)
Called and left message on VM in regards to appointment on 11/18/17 at 2:40pm.  Asked patient to give our office a call back if unable to keep appointment.

## 2017-11-01 ENCOUNTER — Ambulatory Visit: Payer: Self-pay | Admitting: Surgery

## 2017-11-01 DIAGNOSIS — N6320 Unspecified lump in the left breast, unspecified quadrant: Secondary | ICD-10-CM

## 2017-11-01 NOTE — H&P (Signed)
Kristine Mccarthy 11/01/2017 12:02 PM Location: [facility] Patient #: [id number] DOB: 10-19-1971 Undefined / Language: [name] / Race: White Female  History of Present Illness Kristine Moores A. Vicy Medico MD; 11/01/2017 2:52 PM) Patient words: Patient sent request of Dr. Karren Cobble for abnormal mammogram and burning in both breasts. Workup revealed an abnormality in the left breast on mammogram. Core biopsy done which showed complex sclerosing lesion. Patient relates history of bilateral breast pain and burning. Denies any breast masses or nipple discharge. This is been on for a number of months.              CLINICAL DATA: 46 year old presenting with burning diffuse bilateral lower breast pain for approximately 1 month. EXAM: 2D DIGITAL DIAGNOSTIC BILATERAL MAMMOGRAM WITH CAD AND ADJUNCT TOMO LIMITED ULTRASOUND BILATERAL BREASTS COMPARISON: None. ACR Breast Density Category c: The breast tissue is heterogeneously dense, which may obscure small masses. FINDINGS: Standard 2D and tomosynthesis full field CC and MLO views of both breasts were obtained. A standard and tomosynthesis spot compression view of the upper outer left breast was also obtained. Focal architectural distortion is identified in the upper outer left breast at posterior depth which persists on the spot compression tomosynthesis images. There is no associated mass. Faint calcifications are present in this region. A focal asymmetry is present in the inner left breast at middle depth without associated architectural distortion or suspicious calcifications. Adjacent circumscribed low-density masses are present in the lower right breast at posterior depth. There is no associated architectural distortion or suspicious calcification. No suspicious findings elsewhere in the right breast. Mammographic images were processed with CAD. On physical exam, there is no palpable abnormality in the upper outer quadrant of  the left breast in the area of the mammographic distortion. Targeted right breast ultrasound is performed, showing multiple masses, none of which are suspicious for malignancy. -7 o'clock, 2 cm from nipple: Circumscribed oval parallel nearly anechoic mass with a thin internal septation measuring 2 x 4 x 4 mm, demonstrating acoustic enhancement and no internal power Doppler flow. - 6 o'clock, 1 cm from nipple: Circumscribed oval parallel hypoechoic mass measuring 2 x 6 x 5 mm, demonstrating no posterior characteristics and demonstrating internal power Doppler flow. - 9 o'clock, 2 cm from nipple, posterior depth: Circumscribed oval parallel anechoic mass measuring 2 x 4 x 5 mm, demonstrating posterior acoustic enhancement and no internal power Doppler flow. - 9 o'clock, 2 cm from nipple, middle depth: Circumscribed oval parallel hypoechoic mass measuring 4 x 5 x 3 mm, demonstrating acoustic enhancement and no internal power Doppler flow. The masses at the 6 o'clock position and 7 o'clock position correspond to the mammographic findings. Targeted left breast ultrasound is performed, showing no sonographic correlate for the architectural distortion in the upper outer quadrant identified on mammography. Multiple masses are identified, none of which are suspicious for malignancy. - 9 o'clock, 1 cm from nipple, anterior depth: Circumscribed oval parallel hypoechoic mass measuring 4 x 7 x 7 mm, demonstrating acoustic enhancement and no internal power Doppler flow. - 9 o'clock, 1 cm from nipple, posterior depth: Circumscribed oval parallel nearly anechoic mass measuring 3 x 4 x 4 mm, demonstrating acoustic enhancement and no internal power Doppler flow. - 9 o'clock, 1 cm from nipple, middle depth: Circumscribed oval parallel anechoic mass with a thin internal septation measuring 2 x 6 x 7 mm, demonstrating acoustic enhancement and no internal power Doppler flow. - 5 o'clock, 1 cm from nipple:  Circumscribed oval parallel nearly anechoic  mass with echogenic internal debris measuring 3 x 7 x 8 mm, demonstrating acoustic enhancement and no internal power Doppler flow. - 2:30 o'clock position, 2 cm from nipple: Circumscribed multicystic mass measuring approximately 6 x 7 x 6 mm, demonstrating no posterior characteristics. Sonographic evaluation of the left axilla demonstrates no pathologic lymphadenopathy. IMPRESSION: 1. Architectural distortion involving the upper outer quadrant of the left breast without sonographic correlate. 2. Multiple likely benign bilateral breast masses. Most of these represent mildly complex cysts. 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 3. No pathologic left axillary lymphadenopathy. RECOMMENDATION: 1. Stereotactic tomosynthesis core needle biopsy of the architectural distortion involving the upper outer left breast. 2. 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 The stereotactic core needle biopsy procedure was discussed with the patient and her questions were answered. She has agreed to proceed and the biopsy has been scheduled for Thursday, November 1 at 10:30 a.m. I have discussed the findings and recommendations with the patient. Results were also provided in writing at the conclusion of the visit. If applicable, a reminder letter will be sent to the patient regarding the next appointment. BI-RADS CATEGORY 4: Suspicious. Electronically Signed By: Evangeline Dakin M.D. On: 10/14/2017 17:18  Result History  MM DIAG BREAST TOMO BILATERAL (Order  #[id number]) on 10/14/2017 - Order Result History Report <[email or web address]> Order-Level Documents:  There are no order-level documents.                          Diagnosis Breast, left, needle core biopsy, UOQ - COMPLEX SCLEROSING LESION WITH CALCIFICATIONS. - SEE COMMENT. Microscopic Comment The results were called to the [facility] on 10/22/17. (JBK:kh 10/22/17).  The patient is a 46 year old female.   Past Surgical History (Tanisha A. Owens Shark, [place]; 11/01/2017 12:02 PM) Breast Biopsy Left.  Diagnostic Studies History (Tanisha A. Owens Shark, [place]; 11/01/2017 12:02 PM) Mammogram within last year Pap Smear 1-5 years ago  Allergies (Tanisha A. Owens Shark, [place]; 11/01/2017 12:03 PM) MORPHINE Allergies Reconciled  Medication History (Tanisha A. Owens Shark, [place]; 11/01/2017 12:03 PM) No Current Medications Medications Reconciled  Social History (Tanisha A. Owens Shark, [place]; 11/01/2017 12:02 PM) Alcohol use Moderate alcohol use. Caffeine use Carbonated beverages, Coffee, Tea. Illicit drug use Remotely quit drug use. Tobacco use Current every day smoker.  Family History (Tanisha A. Owens Shark, [place]; 11/01/2017 12:02 PM) Alcohol Abuse Family Members In General. Breast Cancer Family Members In General. Heart Disease Father. Heart disease in female family member before age 58 Heart disease in female family member before age 41 Malignant Neoplasm Of Pancreas Family Members In General. Ovarian Cancer Family Members In General. Thyroid problems Daughter.  Pregnancy / Birth History (Tanisha A. Owens Shark, [place]; 11/01/2017 12:02 PM) Age at menarche 45 years. Gravida 4 Maternal age 47-20 Para 2 Regular periods  Other Problems (Tanisha A. Owens Shark, [place]; 11/01/2017 12:02 PM) Back Pain     Review of Systems (Tanisha A. Brown RMA; 11/01/2017 12:02 PM) General Not Present- Appetite Loss, Chills, Fatigue, Fever, Night Sweats, Weight Gain and  Weight  Loss. Skin Not Present- Change in Wart/Mole, Dryness, Hives, Jaundice, New Lesions, Non-Healing Wounds, Rash and Ulcer. HEENT Present- Seasonal Allergies. Not Present- Earache, Hearing Loss, Hoarseness, Nose Bleed, Oral Ulcers, Ringing in the Ears, Sinus Pain, Sore Throat, Visual Disturbances, Wears glasses/contact lenses and Yellow Eyes. Respiratory Present- Wheezing. Not Present- Bloody sputum, Chronic Cough, Difficulty Breathing and Snoring. Breast Present- Breast Pain. Not Present- Breast Mass, Nipple Discharge and Skin Changes. Cardiovascular Not Present- Chest Pain, Difficulty Breathing Lying Down, Leg Cramps, Palpitations, Rapid Heart Rate, Shortness of Breath and Swelling of Extremities. Gastrointestinal Present- Constipation. Not Present- Abdominal Pain, Bloating, Bloody Stool, Change in Bowel Habits, Chronic diarrhea, Difficulty Swallowing, Excessive gas, Gets full quickly at meals, Hemorrhoids, Indigestion, Nausea, Rectal Pain and Vomiting. Female Genitourinary Not Present- Frequency, Nocturia, Painful Urination, Pelvic Pain and Urgency. Musculoskeletal Present- Back Pain. Not Present- Joint Pain, Joint Stiffness, Muscle Pain, Muscle Weakness and Swelling of Extremities. Neurological Not Present- Decreased Memory, Fainting, Headaches, Numbness, Seizures, Tingling, Tremor, Trouble walking and Weakness. Psychiatric Not Present- Anxiety, Bipolar, Change in Sleep Pattern, Depression, Fearful and Frequent crying. Endocrine Present- Hot flashes. Not Present- Cold Intolerance, Excessive Hunger, Hair Changes, Heat Intolerance and New Diabetes. Hematology Not Present- Blood Thinners, Easy Bruising, Excessive bleeding, Gland problems, HIV and Persistent Infections.  Vitals (Tanisha A. Brown RMA; 11/01/2017 12:03 PM) 11/01/2017 12:02 PM Weight: 146 lb Height: 63in Body Surface Area: 1.69 m Body Mass Index: 25.86 kg/m  Temp.: 97.42F  Pulse: 79 (Regular)  BP: 124/86 (Sitting, Left Arm,  Standard)      Physical Exam (Towanda Hornstein A. Jahmeek Shirk MD; 11/01/2017 2:52 PM)  General Mental Status-Alert. General Appearance-Consistent with stated age. Hydration-Well hydrated. Voice-Normal.  Head and Neck Head-normocephalic, atraumatic with no lesions or palpable masses. Trachea-midline. Thyroid Gland Characteristics - normal size and consistency.  Chest and Lung Exam Chest and lung exam reveals -quiet, even and easy respiratory effort with no use of accessory muscles and on auscultation, normal breath sounds, no adventitious sounds and normal vocal resonance. Inspection Chest Wall - Normal. Back - normal.  Breast Breast - Left-Symmetric, Non Tender, No Biopsy scars, no Dimpling, No Inflammation, No Lumpectomy scars, No Mastectomy scars, No Peau d' Orange. Breast - Right-Symmetric, Non Tender, No Biopsy scars, no Dimpling, No Inflammation, No Lumpectomy scars, No Mastectomy scars, No Peau d' Orange. Breast Lump-No Palpable Breast Mass.  Cardiovascular Cardiovascular examination reveals -normal heart sounds, regular rate and rhythm with no murmurs and normal pedal pulses bilaterally.  Musculoskeletal Normal Exam - Left-Upper Extremity Strength Normal and Lower Extremity Strength Normal. Normal Exam - Right-Upper Extremity Strength Normal and Lower Extremity Strength Normal.  Lymphatic Head & Neck  General Head & Neck Lymphatics: Bilateral - Description - Normal. Axillary  General Axillary Region: Bilateral - Description - Normal. Tenderness - Non Tender.    Assessment & Plan (Danitza Schoenfeldt A. Grahm Etsitty MD; 11/01/2017 2:53 PM)  LEFT BREAST MASS (N63.20) Impression: Risk of lumpectomy include bleeding, infection, seroma, more surgery, use of seed/wire, wound care, cosmetic deformity and the need for other treatments, death , blood clots, death. Pt agrees to proceed.  Discussed nonoperative management.  Discussed potential risks 1 menses being around  3-4%.  She would like to proceed with excision.  Current Plans Pt Education - [facility] Free Text Education/Instructions: discussed with patient and provided information. Pt Education - [facility] Breast Biopsy HCI: discussed with patient and provided information. The anatomy and the physiology was discussed. The pathophysiology and natural history of the disease was discussed. Options were discussed  and recommendations were made. Technique, risks, benefits, & alternatives were discussed. Risks such as stroke, heart attack, bleeding, indection, death, and other risks discussed. Questions answered. The patient agrees to proceed.

## 2017-11-01 NOTE — H&P (View-Only) (Signed)
Kristine Mccarthy 11/01/2017 12:02 PM Location: [facility] Patient #: [id number] DOB: [birth year] Undefined / Language: [name] / Race: White Female  History of Present Illness Marcello Moores A. Tristyn Pharris MD; 11/01/2017 2:52 PM) Patient words: Patient sent request of Dr. Karren Cobble for abnormal mammogram and burning in both breasts. Workup revealed an abnormality in the left breast on mammogram. Core biopsy done which showed complex sclerosing lesion. Patient relates history of bilateral breast pain and burning. Denies any breast masses or nipple discharge. This is been on for a number of months.              CLINICAL DATA: 46 year old presenting with burning diffuse bilateral lower breast pain for approximately 1 month. EXAM: 2D DIGITAL DIAGNOSTIC BILATERAL MAMMOGRAM WITH CAD AND ADJUNCT TOMO LIMITED ULTRASOUND BILATERAL BREASTS COMPARISON: None. ACR Breast Density Category c: The breast tissue is heterogeneously dense, which may obscure small masses. FINDINGS: Standard 2D and tomosynthesis full field CC and MLO views of both breasts were obtained. A standard and tomosynthesis spot compression view of the upper outer left breast was also obtained. Focal architectural distortion is identified in the upper outer left breast at posterior depth which persists on the spot compression tomosynthesis images. There is no associated mass. Faint calcifications are present in this region. A focal asymmetry is present in the inner left breast at middle depth without associated architectural distortion or suspicious calcifications. Adjacent circumscribed low-density masses are present in the lower right breast at posterior depth. There is no associated architectural distortion or suspicious calcification. No suspicious findings elsewhere in the right breast. Mammographic images were processed with CAD. On physical exam, there is no palpable abnormality in the upper outer quadrant of  the left breast in the area of the mammographic distortion. Targeted right breast ultrasound is performed, showing multiple masses, none of which are suspicious for malignancy. -7 o'clock, 2 cm from nipple: Circumscribed oval parallel nearly anechoic mass with a thin internal septation measuring 2 x 4 x 4 mm, demonstrating acoustic enhancement and no internal power Doppler flow. - 6 o'clock, 1 cm from nipple: Circumscribed oval parallel hypoechoic mass measuring 2 x 6 x 5 mm, demonstrating no posterior characteristics and demonstrating internal power Doppler flow. - 9 o'clock, 2 cm from nipple, posterior depth: Circumscribed oval parallel anechoic mass measuring 2 x 4 x 5 mm, demonstrating posterior acoustic enhancement and no internal power Doppler flow. - 9 o'clock, 2 cm from nipple, middle depth: Circumscribed oval parallel hypoechoic mass measuring 4 x 5 x 3 mm, demonstrating acoustic enhancement and no internal power Doppler flow. The masses at the 6 o'clock position and 7 o'clock position correspond to the mammographic findings. Targeted left breast ultrasound is performed, showing no sonographic correlate for the architectural distortion in the upper outer quadrant identified on mammography. Multiple masses are identified, none of which are suspicious for malignancy. - 9 o'clock, 1 cm from nipple, anterior depth: Circumscribed oval parallel hypoechoic mass measuring 4 x 7 x 7 mm, demonstrating acoustic enhancement and no internal power Doppler flow. - 9 o'clock, 1 cm from nipple, posterior depth: Circumscribed oval parallel nearly anechoic mass measuring 3 x 4 x 4 mm, demonstrating acoustic enhancement and no internal power Doppler flow. - 9 o'clock, 1 cm from nipple, middle depth: Circumscribed oval parallel anechoic mass with a thin internal septation measuring 2 x 6 x 7 mm, demonstrating acoustic enhancement and no internal power Doppler flow. - 5 o'clock, 1 cm from nipple:  Circumscribed oval parallel nearly anechoic  mass with echogenic internal debris measuring 3 x 7 x 8 mm, demonstrating acoustic enhancement and no internal power Doppler flow. - 2:30 o'clock position, 2 cm from nipple: Circumscribed multicystic mass measuring approximately 6 x 7 x 6 mm, demonstrating no posterior characteristics. Sonographic evaluation of the left axilla demonstrates no pathologic lymphadenopathy. IMPRESSION: 1. Architectural distortion involving the upper outer quadrant of the left breast without sonographic correlate. 2. Multiple likely benign bilateral breast masses. Most of these represent mildly complex cysts. 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 3. No pathologic left axillary lymphadenopathy. RECOMMENDATION: 1. Stereotactic tomosynthesis core needle biopsy of the architectural distortion involving the upper outer left breast. 2. 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 The stereotactic core needle biopsy procedure was discussed with the patient and her questions were answered. She has agreed to proceed and the biopsy has been scheduled for Thursday, November 1 at 10:30 a.m. I have discussed the findings and recommendations with the patient. Results were also provided in writing at the conclusion of the visit. If applicable, a reminder letter will be sent to the patient regarding the next appointment. BI-RADS CATEGORY 4: Suspicious. Electronically Signed By: Evangeline Dakin M.D. On: 10/14/2017 17:18  Result History  MM DIAG BREAST TOMO BILATERAL (Order  #[id number]) on 10/14/2017 - Order Result History Report <[email or web address]> Order-Level Documents:  There are no order-level documents.                          Diagnosis Breast, left, needle core biopsy, UOQ - COMPLEX SCLEROSING LESION WITH CALCIFICATIONS. - SEE COMMENT. Microscopic Comment The results were called to the [facility] on 10/22/17. (JBK:kh 10/22/17).  The patient is a 46 year old female.   Past Surgical History (Tanisha A. Owens Shark, [place]; 11/01/2017 12:02 PM) Breast Biopsy Left.  Diagnostic Studies History (Tanisha A. Owens Shark, [place]; 11/01/2017 12:02 PM) Mammogram within last year Pap Smear 1-5 years ago  Allergies (Tanisha A. Owens Shark, [place]; 11/01/2017 12:03 PM) MORPHINE Allergies Reconciled  Medication History (Tanisha A. Owens Shark, [place]; 11/01/2017 12:03 PM) No Current Medications Medications Reconciled  Social History (Tanisha A. Owens Shark, [place]; 11/01/2017 12:02 PM) Alcohol use Moderate alcohol use. Caffeine use Carbonated beverages, Coffee, Tea. Illicit drug use Remotely quit drug use. Tobacco use Current every day smoker.  Family History (Tanisha A. Owens Shark, [place]; 11/01/2017 12:02 PM) Alcohol Abuse Family Members In General. Breast Cancer Family Members In General. Heart Disease Father. Heart disease in female family member before age 83 Heart disease in female family member before age 43 Malignant Neoplasm Of Pancreas Family Members In General. Ovarian Cancer Family Members In General. Thyroid problems Daughter.  Pregnancy / Birth History (Tanisha A. Owens Shark, [place]; 11/01/2017 12:02 PM) Age at menarche 41 years. Gravida 4 Maternal age 41-20 Para 2 Regular periods  Other Problems (Tanisha A. Owens Shark, [place]; 11/01/2017 12:02 PM) Back Pain     Review of Systems (Tanisha A. Brown RMA; 11/01/2017 12:02 PM) General Not Present- Appetite Loss, Chills, Fatigue, Fever, Night Sweats, Weight Gain and  Weight  Loss. Skin Not Present- Change in Wart/Mole, Dryness, Hives, Jaundice, New Lesions, Non-Healing Wounds, Rash and Ulcer. HEENT Present- Seasonal Allergies. Not Present- Earache, Hearing Loss, Hoarseness, Nose Bleed, Oral Ulcers, Ringing in the Ears, Sinus Pain, Sore Throat, Visual Disturbances, Wears glasses/contact lenses and Yellow Eyes. Respiratory Present- Wheezing. Not Present- Bloody sputum, Chronic Cough, Difficulty Breathing and Snoring. Breast Present- Breast Pain. Not Present- Breast Mass, Nipple Discharge and Skin Changes. Cardiovascular Not Present- Chest Pain, Difficulty Breathing Lying Down, Leg Cramps, Palpitations, Rapid Heart Rate, Shortness of Breath and Swelling of Extremities. Gastrointestinal Present- Constipation. Not Present- Abdominal Pain, Bloating, Bloody Stool, Change in Bowel Habits, Chronic diarrhea, Difficulty Swallowing, Excessive gas, Gets full quickly at meals, Hemorrhoids, Indigestion, Nausea, Rectal Pain and Vomiting. Female Genitourinary Not Present- Frequency, Nocturia, Painful Urination, Pelvic Pain and Urgency. Musculoskeletal Present- Back Pain. Not Present- Joint Pain, Joint Stiffness, Muscle Pain, Muscle Weakness and Swelling of Extremities. Neurological Not Present- Decreased Memory, Fainting, Headaches, Numbness, Seizures, Tingling, Tremor, Trouble walking and Weakness. Psychiatric Not Present- Anxiety, Bipolar, Change in Sleep Pattern, Depression, Fearful and Frequent crying. Endocrine Present- Hot flashes. Not Present- Cold Intolerance, Excessive Hunger, Hair Changes, Heat Intolerance and New Diabetes. Hematology Not Present- Blood Thinners, Easy Bruising, Excessive bleeding, Gland problems, HIV and Persistent Infections.  Vitals (Tanisha A. Brown RMA; 11/01/2017 12:03 PM) 11/01/2017 12:02 PM Weight: 146 lb Height: 63in Body Surface Area: 1.69 m Body Mass Index: 25.86 kg/m  Temp.: 97.72F  Pulse: 79 (Regular)  BP: 124/86 (Sitting, Left Arm,  Standard)      Physical Exam (Chelbie Jarnagin A. Tieara Flitton MD; 11/01/2017 2:52 PM)  General Mental Status-Alert. General Appearance-Consistent with stated age. Hydration-Well hydrated. Voice-Normal.  Head and Neck Head-normocephalic, atraumatic with no lesions or palpable masses. Trachea-midline. Thyroid Gland Characteristics - normal size and consistency.  Chest and Lung Exam Chest and lung exam reveals -quiet, even and easy respiratory effort with no use of accessory muscles and on auscultation, normal breath sounds, no adventitious sounds and normal vocal resonance. Inspection Chest Wall - Normal. Back - normal.  Breast Breast - Left-Symmetric, Non Tender, No Biopsy scars, no Dimpling, No Inflammation, No Lumpectomy scars, No Mastectomy scars, No Peau d' Orange. Breast - Right-Symmetric, Non Tender, No Biopsy scars, no Dimpling, No Inflammation, No Lumpectomy scars, No Mastectomy scars, No Peau d' Orange. Breast Lump-No Palpable Breast Mass.  Cardiovascular Cardiovascular examination reveals -normal heart sounds, regular rate and rhythm with no murmurs and normal pedal pulses bilaterally.  Musculoskeletal Normal Exam - Left-Upper Extremity Strength Normal and Lower Extremity Strength Normal. Normal Exam - Right-Upper Extremity Strength Normal and Lower Extremity Strength Normal.  Lymphatic Head & Neck  General Head & Neck Lymphatics: Bilateral - Description - Normal. Axillary  General Axillary Region: Bilateral - Description - Normal. Tenderness - Non Tender.    Assessment & Plan (Shantee Hayne A. Monike Bragdon MD; 11/01/2017 2:53 PM)  LEFT BREAST MASS (N63.20) Impression: Risk of lumpectomy include bleeding, infection, seroma, more surgery, use of seed/wire, wound care, cosmetic deformity and the need for other treatments, death , blood clots, death. Pt agrees to proceed.  Discussed nonoperative management.  Discussed potential risks 1 menses being around  3-4%.  She would like to proceed with excision.  Current Plans Pt Education - [facility] Free Text Education/Instructions: discussed with patient and provided information. Pt Education - [facility] Breast Biopsy HCI: discussed with patient and provided information. The anatomy and the physiology was discussed. The pathophysiology and natural history of the disease was discussed. Options were discussed  and recommendations were made. Technique, risks, benefits, & alternatives were discussed. Risks such as stroke, heart attack, bleeding, indection, death, and other risks discussed. Questions answered. The patient agrees to proceed.

## 2017-11-03 ENCOUNTER — Other Ambulatory Visit: Payer: Self-pay | Admitting: Surgery

## 2017-11-03 DIAGNOSIS — N6320 Unspecified lump in the left breast, unspecified quadrant: Secondary | ICD-10-CM

## 2017-11-18 ENCOUNTER — Encounter: Payer: Self-pay | Admitting: Obstetrics and Gynecology

## 2017-11-19 NOTE — Pre-Procedure Instructions (Addendum)
HETHER ANSELMO  11/19/2017      [facility] ([facility] [place] [address] [place] ([place]) [place] [address] Phone: [phone] Fax: [phone]    Your procedure is scheduled on Friday, November 26, 2017  Report to [facility] Admitting Entrance "A" at 5:30AM   Call this number if you have problems the morning of surgery:  [phone]   Remember:  Do not eat food or drink liquids after midnight.  Take these medicines the morning of surgery with A SIP OF WATER: If needed Albuterol Inhaler for cough or wheezing (Bring with you the day of surgery).  Please complete your PRE-SURGERY ENSURE that was given to before you leave your house the morning of surgery.  Please, if able, drink it in one setting. DO NOT SIP.  As of today, stop taking all Aspirins, Vitamins, Fish oils, and Herbal medications. Also stop all NSAIDS i.e. Advil, Ibuprofen, Motrin, Aleve, Anaprox, Naproxen, BC and Goody Powders.   Do not wear jewelry, make-up or nail polish.  Do not wear lotions, powders, perfumes, or deodorant.  Do not shave 48 hours prior to surgery.    Do not bring valuables to the hospital.  [facility] is not responsible for any belongings or valuables.  Contacts, dentures or bridgework may not be worn into surgery.  Leave your suitcase in the car.  After surgery it may be brought to your room.  For patients admitted to the hospital, discharge time will be determined by your treatment team.  Patients discharged the day of surgery will not be allowed to drive home.   Special instructions:   [facility]- Preparing For Surgery  Before surgery, you can play an important role. Because skin is not sterile, your skin needs to be as free of germs as possible. You can reduce the number of germs on your skin by washing with CHG (chlorahexidine gluconate) Soap before surgery.  CHG is an antiseptic cleaner which kills germs and bonds with the skin to  continue killing germs even after washing.  Please do not use if you have an allergy to CHG or antibacterial soaps. If your skin becomes reddened/irritated stop using the CHG.  Do not shave (including legs and underarms) for at least 48 hours prior to first CHG shower. It is OK to shave your face.  Please follow these instructions carefully.   1. Shower the NIGHT BEFORE SURGERY and the MORNING OF SURGERY with CHG.   2. If you chose to wash your hair, wash your hair first as usual with your normal shampoo.  3. After you shampoo, rinse your hair and body thoroughly to remove the shampoo.  4. Use CHG as you would any other liquid soap. You can apply CHG directly to the skin and wash gently with a scrungie or a clean washcloth.   5. Apply the CHG Soap to your body ONLY FROM THE NECK DOWN.  Do not use on open wounds or open sores. Avoid contact with your eyes, ears, mouth and genitals (private parts). Wash Face and genitals (private parts)  with your normal soap.  6. Wash thoroughly, paying special attention to the area where your surgery will be performed.  7. Thoroughly rinse your body with warm water from the neck down.  8. DO NOT shower/wash with your normal soap after using and rinsing off the CHG Soap.  9. Pat yourself dry with a CLEAN TOWEL.  10. Wear CLEAN  PAJAMAS to bed the night before surgery, wear comfortable clothes the morning of surgery  11. Place CLEAN SHEETS on your bed the night of your first shower and DO NOT SLEEP WITH PETS.  Day of Surgery: Do not apply any deodorants/lotions. Please wear clean clothes to the hospital/surgery center.    Please read over the following fact sheets that you were given. Pain Booklet, Coughing and Deep Breathing and Surgical Site Infection Prevention

## 2017-11-22 ENCOUNTER — Encounter (HOSPITAL_COMMUNITY): Payer: Self-pay

## 2017-11-22 ENCOUNTER — Encounter (HOSPITAL_COMMUNITY)
Admission: RE | Admit: 2017-11-22 | Discharge: 2017-11-22 | Disposition: A | Discharge status: Home / Self Care | Payer: Self-pay | Source: Ambulatory Visit | Attending: Surgery | Admitting: Surgery

## 2017-11-22 ENCOUNTER — Other Ambulatory Visit: Payer: Self-pay

## 2017-11-22 DIAGNOSIS — Z01812 Encounter for preprocedural laboratory examination: Secondary | ICD-10-CM | POA: Insufficient documentation

## 2017-11-22 HISTORY — DX: Unspecified lump in unspecified breast: N63.0

## 2017-11-22 HISTORY — DX: Bronchitis, not specified as acute or chronic: J40

## 2017-11-22 LAB — BASIC METABOLIC PANEL
ANION GAP: 7 (ref 5–15)
BUN: 11 mg/dL (ref 6–20)
CALCIUM: 8.9 mg/dL (ref 8.9–10.3)
CO2: 22 mmol/L (ref 22–32)
Chloride: 107 mmol/L (ref 101–111)
Creatinine, Ser: 0.64 mg/dL (ref 0.44–1.00)
GFR calc Af Amer: >60 mL/min (ref >60)
GLUCOSE: 87 mg/dL (ref 65–99)
Potassium: 3.9 mmol/L (ref 3.5–5.1)
Sodium: 136 mmol/L (ref 135–145)

## 2017-11-22 LAB — CBC
HCT: 37.1 % (ref 36.0–46.0)
Hemoglobin: 12.1 g/dL (ref 12.0–15.0)
MCH: 29.2 pg (ref 26.0–34.0)
MCHC: 32.6 g/dL (ref 30.0–36.0)
MCV: 89.4 fL (ref 78.0–100.0)
PLATELETS: 263 10*3/uL (ref 150–400)
RBC: 4.15 MIL/uL (ref 3.87–5.11)
RDW: 13.6 % (ref 11.5–15.5)
WBC: 9 10*3/uL (ref 4.0–10.5)

## 2017-11-22 LAB — HCG, SERUM, QUALITATIVE: PREG SERUM: NEGATIVE

## 2017-11-22 LAB — HEPATIC FUNCTION PANEL
ALBUMIN: 3.4 g/dL — AB (ref 3.5–5.0)
ALT: 18 U/L (ref 14–54)
AST: 23 U/L (ref 15–41)
Alkaline Phosphatase: 42 U/L (ref 38–126)
BILIRUBIN TOTAL: 0.3 mg/dL (ref 0.3–1.2)
Bilirubin, Direct: <0.1 mg/dL — ABNORMAL LOW (ref 0.1–0.5)
TOTAL PROTEIN: 6.2 g/dL — AB (ref 6.5–8.1)

## 2017-11-22 NOTE — Progress Notes (Signed)
PCP - Denies  Cardiologist - Denies  Chest x-ray - Denies  EKG - Denies  Stress Test - Denies  ECHO - Denies  Cardiac Cath - Denies  Sleep Study - No CPAP - None   Pt denies having chest pain, sob, or fever at this time. All instructions explained to the pt, with a verbal understanding of the material. Pt agrees to go over the instructions while at home for a better understanding. The opportunity to ask questions was provided.

## 2017-11-25 ENCOUNTER — Ambulatory Visit
Admission: RE | Admit: 2017-11-25 | Discharge: 2017-11-25 | Disposition: A | Discharge status: Home / Self Care | Payer: No Typology Code available for payment source | Source: Ambulatory Visit | Attending: Surgery | Admitting: Surgery

## 2017-11-25 DIAGNOSIS — N6320 Unspecified lump in the left breast, unspecified quadrant: Secondary | ICD-10-CM

## 2017-11-25 MED ORDER — CEFAZOLIN SODIUM-DEXTROSE 2-4 GM/100ML-% IV SOLN
2.0000 g | INTRAVENOUS | Status: AC
  Administered 2017-11-26: 2 g via INTRAVENOUS
  Filled 2017-11-25: qty 100

## 2017-11-25 NOTE — Anesthesia Preprocedure Evaluation (Addendum)
Anesthesia Evaluation  Patient identified by MRN, date of birth, ID band Patient awake    Reviewed: Allergy & Precautions, NPO status , Patient's Chart, lab work & pertinent test results  Airway Mallampati: I       Dental  (+) Edentulous Upper, Edentulous Lower   Pulmonary neg pulmonary ROS, Current Smoker,    Pulmonary exam normal breath sounds clear to auscultation       Cardiovascular negative cardio ROS Normal cardiovascular exam Rhythm:Regular Rate:Normal     Neuro/Psych negative neurological ROS  negative psych ROS   GI/Hepatic negative GI ROS, Neg liver ROS,   Endo/Other  negative endocrine ROS  Renal/GU negative Renal ROS  negative genitourinary   Musculoskeletal   Abdominal Normal abdominal exam  (+)   Peds  Hematology negative hematology ROS (+)   Anesthesia Other Findings   Reproductive/Obstetrics negative OB ROS                           Anesthesia Physical Anesthesia Plan  ASA: II  Anesthesia Plan: General   Post-op Pain Management:    Induction: Intravenous  PONV Risk Score and Plan: 3 and Dexamethasone, Ondansetron, Scopolamine patch - Pre-op and Midazolam  Airway Management Planned: LMA  Additional Equipment:   Intra-op Plan:   Post-operative Plan:   Informed Consent: I have reviewed the patients History and Physical, chart, labs and discussed the procedure including the risks, benefits and alternatives for the proposed anesthesia with the patient or authorized representative who has indicated his/her understanding and acceptance.     Plan Discussed with: CRNA and Surgeon  Anesthesia Plan Comments:       Anesthesia Quick Evaluation

## 2017-11-26 ENCOUNTER — Ambulatory Visit
Admission: RE | Admit: 2017-11-26 | Discharge: 2017-11-26 | Disposition: A | Discharge status: Home / Self Care | Payer: No Typology Code available for payment source | Source: Ambulatory Visit | Attending: Surgery | Admitting: Surgery

## 2017-11-26 ENCOUNTER — Ambulatory Visit (HOSPITAL_COMMUNITY): Payer: Self-pay | Admitting: Anesthesiology

## 2017-11-26 ENCOUNTER — Ambulatory Visit (HOSPITAL_COMMUNITY)
Admission: RE | Admit: 2017-11-26 | Discharge: 2017-11-26 | Disposition: A | Discharge status: Home / Self Care | Payer: Self-pay | Source: Ambulatory Visit | Attending: Surgery | Admitting: Surgery

## 2017-11-26 ENCOUNTER — Encounter (HOSPITAL_COMMUNITY)
Admission: RE | Disposition: A | Discharge status: Home / Self Care | Payer: Self-pay | Source: Ambulatory Visit | Attending: Surgery

## 2017-11-26 ENCOUNTER — Encounter (HOSPITAL_COMMUNITY): Payer: Self-pay | Admitting: *Deleted

## 2017-11-26 DIAGNOSIS — F17200 Nicotine dependence, unspecified, uncomplicated: Secondary | ICD-10-CM | POA: Insufficient documentation

## 2017-11-26 DIAGNOSIS — N62 Hypertrophy of breast: Secondary | ICD-10-CM | POA: Insufficient documentation

## 2017-11-26 DIAGNOSIS — L905 Scar conditions and fibrosis of skin: Secondary | ICD-10-CM | POA: Insufficient documentation

## 2017-11-26 DIAGNOSIS — N6320 Unspecified lump in the left breast, unspecified quadrant: Secondary | ICD-10-CM

## 2017-11-26 HISTORY — PX: BREAST LUMPECTOMY WITH RADIOACTIVE SEED LOCALIZATION: SHX6424

## 2017-11-26 HISTORY — PX: BREAST EXCISIONAL BIOPSY: SUR124

## 2017-11-26 SURGERY — BREAST LUMPECTOMY WITH RADIOACTIVE SEED LOCALIZATION
Anesthesia: General | Site: Breast | Laterality: Left

## 2017-11-26 MED ORDER — ACETAMINOPHEN 325 MG PO TABS
ORAL_TABLET | ORAL | Status: AC
  Filled 2017-11-26: qty 2

## 2017-11-26 MED ORDER — BUPIVACAINE-EPINEPHRINE (PF) 0.25% -1:200000 IJ SOLN
INTRAMUSCULAR | Status: AC
Start: 2017-11-26 — End: ?
  Filled 2017-11-26: qty 30

## 2017-11-26 MED ORDER — GLYCOPYRROLATE 0.2 MG/ML IJ SOLN
INTRAMUSCULAR | Status: DC | PRN
  Administered 2017-11-26: 0.1 mg via INTRAVENOUS

## 2017-11-26 MED ORDER — ONDANSETRON HCL 4 MG/2ML IJ SOLN: 4.0000 mg | Freq: Once | INTRAMUSCULAR | Status: DC | PRN

## 2017-11-26 MED ORDER — OXYCODONE HCL 5 MG PO TABS: 5.0000 mg | ORAL_TABLET | Freq: Once | ORAL | Status: DC | PRN

## 2017-11-26 MED ORDER — IBUPROFEN 800 MG PO TABS: 800.0000 mg | ORAL_TABLET | Freq: Three times a day (TID) | ORAL | 0 refills | Status: DC | PRN

## 2017-11-26 MED ORDER — OXYCODONE HCL 5 MG PO TABS: 5.0000 mg | ORAL_TABLET | Freq: Four times a day (QID) | ORAL | 0 refills | Status: DC | PRN

## 2017-11-26 MED ORDER — CELECOXIB 200 MG PO CAPS
200.0000 mg | ORAL_CAPSULE | ORAL | Status: AC
  Administered 2017-11-26: 200 mg via ORAL
  Filled 2017-11-26: qty 1

## 2017-11-26 MED ORDER — KETOROLAC TROMETHAMINE 30 MG/ML IJ SOLN
INTRAMUSCULAR | Status: AC
  Filled 2017-11-26: qty 1

## 2017-11-26 MED ORDER — MIDAZOLAM HCL 5 MG/5ML IJ SOLN
INTRAMUSCULAR | Status: DC | PRN
  Administered 2017-11-26: 2 mg via INTRAVENOUS

## 2017-11-26 MED ORDER — DEXAMETHASONE SODIUM PHOSPHATE 10 MG/ML IJ SOLN
INTRAMUSCULAR | Status: DC | PRN
  Administered 2017-11-26: 10 mg via INTRAVENOUS

## 2017-11-26 MED ORDER — ACETAMINOPHEN 325 MG PO TABS
325.0000 mg | ORAL_TABLET | ORAL | Status: DC | PRN
  Administered 2017-11-26: 650 mg via ORAL

## 2017-11-26 MED ORDER — GABAPENTIN 300 MG PO CAPS
300.0000 mg | ORAL_CAPSULE | ORAL | Status: AC
  Administered 2017-11-26: 300 mg via ORAL
  Filled 2017-11-26: qty 1

## 2017-11-26 MED ORDER — MEPERIDINE HCL 25 MG/ML IJ SOLN: 6.2500 mg | INTRAMUSCULAR | Status: DC | PRN

## 2017-11-26 MED ORDER — ACETAMINOPHEN 160 MG/5ML PO SOLN: 325.0000 mg | ORAL | Status: DC | PRN

## 2017-11-26 MED ORDER — OXYCODONE HCL 5 MG/5ML PO SOLN: 5.0000 mg | Freq: Once | ORAL | Status: DC | PRN

## 2017-11-26 MED ORDER — LIDOCAINE HCL (CARDIAC) 20 MG/ML IV SOLN
INTRAVENOUS | Status: DC | PRN
  Administered 2017-11-26: 80 mg via INTRAVENOUS

## 2017-11-26 MED ORDER — KETOROLAC TROMETHAMINE 30 MG/ML IJ SOLN
30.0000 mg | Freq: Once | INTRAMUSCULAR | Status: DC | PRN
  Administered 2017-11-26: 30 mg via INTRAVENOUS

## 2017-11-26 MED ORDER — PROPOFOL 10 MG/ML IV BOLUS
INTRAVENOUS | Status: DC | PRN
Start: 2017-11-26 — End: 2017-11-26
  Administered 2017-11-26: 200 mg via INTRAVENOUS

## 2017-11-26 MED ORDER — SCOPOLAMINE 1 MG/3DAYS TD PT72
MEDICATED_PATCH | TRANSDERMAL | Status: AC
  Filled 2017-11-26: qty 1

## 2017-11-26 MED ORDER — FENTANYL CITRATE (PF) 250 MCG/5ML IJ SOLN
INTRAMUSCULAR | Status: AC
  Filled 2017-11-26: qty 5

## 2017-11-26 MED ORDER — 0.9 % SODIUM CHLORIDE (POUR BTL) OPTIME
TOPICAL | Status: DC | PRN
  Administered 2017-11-26: 1000 mL

## 2017-11-26 MED ORDER — LACTATED RINGERS IV SOLN
INTRAVENOUS | Status: DC | PRN
  Administered 2017-11-26: 07:00:00 via INTRAVENOUS

## 2017-11-26 MED ORDER — PROPOFOL 10 MG/ML IV BOLUS
INTRAVENOUS | Status: AC
  Filled 2017-11-26: qty 20

## 2017-11-26 MED ORDER — BUPIVACAINE-EPINEPHRINE 0.25% -1:200000 IJ SOLN
INTRAMUSCULAR | Status: DC | PRN
  Administered 2017-11-26: 10 mL

## 2017-11-26 MED ORDER — FENTANYL CITRATE (PF) 100 MCG/2ML IJ SOLN
INTRAMUSCULAR | Status: DC | PRN
  Administered 2017-11-26 (×2): 25 ug via INTRAVENOUS

## 2017-11-26 MED ORDER — CHLORHEXIDINE GLUCONATE CLOTH 2 % EX PADS: 6.0000 | MEDICATED_PAD | Freq: Once | CUTANEOUS | Status: DC

## 2017-11-26 MED ORDER — FENTANYL CITRATE (PF) 100 MCG/2ML IJ SOLN: 25.0000 ug | INTRAMUSCULAR | Status: DC | PRN

## 2017-11-26 MED ORDER — MIDAZOLAM HCL 2 MG/2ML IJ SOLN
INTRAMUSCULAR | Status: AC
  Filled 2017-11-26: qty 2

## 2017-11-26 MED ORDER — ONDANSETRON HCL 4 MG/2ML IJ SOLN
INTRAMUSCULAR | Status: DC | PRN
  Administered 2017-11-26: 4 mg via INTRAVENOUS

## 2017-11-26 MED ORDER — SCOPOLAMINE 1 MG/3DAYS TD PT72
MEDICATED_PATCH | TRANSDERMAL | Status: DC | PRN
  Administered 2017-11-26: 1 via TRANSDERMAL

## 2017-11-26 MED ORDER — ACETAMINOPHEN 500 MG PO TABS
1000.0000 mg | ORAL_TABLET | ORAL | Status: AC
  Administered 2017-11-26: 1000 mg via ORAL
  Filled 2017-11-26: qty 2

## 2017-11-26 SURGICAL SUPPLY — 46 items
APPLIER CLIP 9.375 MED OPEN (MISCELLANEOUS)
BINDER BREAST LRG (GAUZE/BANDAGES/DRESSINGS) ×3 IMPLANT
BINDER BREAST XLRG (GAUZE/BANDAGES/DRESSINGS) IMPLANT
BLADE SURG 15 STRL LF DISP TIS (BLADE) ×1 IMPLANT
BLADE SURG 15 STRL SS (BLADE) ×2
CANISTER SUCT 3000ML PPV (MISCELLANEOUS) IMPLANT
CHLORAPREP W/TINT 26ML (MISCELLANEOUS) ×3 IMPLANT
CLIP APPLIE 9.375 MED OPEN (MISCELLANEOUS) IMPLANT
COVER LIGHT HANDLE STERIS (MISCELLANEOUS) ×3 IMPLANT
COVER PROBE W GEL 5X96 (DRAPES) ×3 IMPLANT
DERMABOND ADVANCED (GAUZE/BANDAGES/DRESSINGS) ×2
DERMABOND ADVANCED .7 DNX12 (GAUZE/BANDAGES/DRESSINGS) ×1 IMPLANT
DEVICE DUBIN SPECIMEN MAMMOGRA (MISCELLANEOUS) ×3 IMPLANT
DRAPE CHEST BREAST 15X10 FENES (DRAPES) ×3 IMPLANT
DRAPE UTILITY XL STRL (DRAPES) ×3 IMPLANT
ELECT CAUTERY BLADE 6.4 (BLADE) ×3 IMPLANT
ELECT REM PT RETURN 9FT ADLT (ELECTROSURGICAL) ×3
ELECTRODE REM PT RTRN 9FT ADLT (ELECTROSURGICAL) ×1 IMPLANT
GLOVE BIO SURGEON STRL SZ8 (GLOVE) ×3 IMPLANT
GLOVE BIOGEL PI IND STRL 8 (GLOVE) ×1 IMPLANT
GLOVE BIOGEL PI INDICATOR 8 (GLOVE) ×2
GOWN STRL REUS W/ TWL LRG LVL3 (GOWN DISPOSABLE) ×1 IMPLANT
GOWN STRL REUS W/ TWL XL LVL3 (GOWN DISPOSABLE) ×1 IMPLANT
GOWN STRL REUS W/TWL LRG LVL3 (GOWN DISPOSABLE) ×2
GOWN STRL REUS W/TWL XL LVL3 (GOWN DISPOSABLE) ×2
KIT BASIN OR (CUSTOM PROCEDURE TRAY) ×3 IMPLANT
KIT MARKER MARGIN INK (KITS) ×3 IMPLANT
LIGHT WAVEGUIDE WIDE FLAT (MISCELLANEOUS) ×3 IMPLANT
NEEDLE HYPO 25GX1X1/2 BEV (NEEDLE) ×3 IMPLANT
NS IRRIG 1000ML POUR BTL (IV SOLUTION) IMPLANT
PACK SURGICAL SETUP 50X90 (CUSTOM PROCEDURE TRAY) ×3 IMPLANT
PENCIL BUTTON HOLSTER BLD 10FT (ELECTRODE) ×3 IMPLANT
SPONGE LAP 18X18 X RAY DECT (DISPOSABLE) ×3 IMPLANT
SUT MNCRL AB 4-0 PS2 18 (SUTURE) ×3 IMPLANT
SUT SILK 2 0 SH (SUTURE) IMPLANT
SUT VIC AB 2-0 SH 27 (SUTURE) ×2
SUT VIC AB 2-0 SH 27XBRD (SUTURE) ×1 IMPLANT
SUT VIC AB 3-0 SH 27 (SUTURE) ×2
SUT VIC AB 3-0 SH 27X BRD (SUTURE) ×1 IMPLANT
SYR BULB 3OZ (MISCELLANEOUS) ×3 IMPLANT
SYR CONTROL 10ML LL (SYRINGE) ×3 IMPLANT
TOWEL OR 17X24 6PK STRL BLUE (TOWEL DISPOSABLE) ×3 IMPLANT
TOWEL OR 17X26 10 PK STRL BLUE (TOWEL DISPOSABLE) ×3 IMPLANT
TUBE CONNECTING 12'X1/4 (SUCTIONS)
TUBE CONNECTING 12X1/4 (SUCTIONS) IMPLANT
YANKAUER SUCT BULB TIP NO VENT (SUCTIONS) IMPLANT

## 2017-11-26 NOTE — Interval H&P Note (Signed)
History and Physical Interval Note:  11/26/2017 7:13 AM  Kristine Mccarthy  has presented today for surgery, with the diagnosis of left breast mass  The various methods of treatment have been discussed with the patient and family. After consideration of risks, benefits and other options for treatment, the patient has consented to  Procedure(s): LEFT BREAST LUMPECTOMY WITH RADIOACTIVE SEED LOCALIZATION ERAS PATHWAY (Left) as a surgical intervention .  The patient's history has been reviewed, patient examined, no change in status, stable for surgery.  I have reviewed the patient's chart and labs.  Questions were answered to the patient's satisfaction.     [name]

## 2017-11-26 NOTE — Anesthesia Procedure Notes (Signed)
Procedure Name: LMA Insertion Date/Time: 11/26/2017 7:49 AM Performed by: Inda Coke, CRNA Pre-anesthesia Checklist: Patient identified, Emergency Drugs available, Suction available and Patient being monitored Patient Re-evaluated:Patient Re-evaluated prior to induction Oxygen Delivery Method: Circle System Utilized Preoxygenation: Pre-oxygenation with 100% oxygen Induction Type: IV induction Ventilation: Mask ventilation without difficulty LMA: LMA inserted LMA Size: 4.0 Number of attempts: 1 Airway Equipment and Method: Bite block Placement Confirmation: positive ETCO2 and breath sounds checked- equal and bilateral Tube secured with: Tape Dental Injury: Teeth and Oropharynx as per pre-operative assessment

## 2017-11-26 NOTE — Transfer of Care (Signed)
Immediate Anesthesia Transfer of Care Note  Patient: Kristine Mccarthy  Procedure(s) Performed: LEFT BREAST LUMPECTOMY WITH RADIOACTIVE SEED LOCALIZATION ERAS PATHWAY (Left Breast)  Patient Location: PACU  Anesthesia Type:General  Level of Consciousness: awake, alert  and oriented  Airway & Oxygen Therapy: Patient Spontanous Breathing and Patient connected to nasal cannula oxygen  Post-op Assessment: Report given to RN, Post -op Vital signs reviewed and stable and Patient moving all extremities X 4  Post vital signs: Reviewed and stable  Last Vitals:  Vitals:   11/26/17 0544  BP: 103/63  Pulse: 79  Resp: 19  Temp: 36.7 C  SpO2: 96%    Last Pain:  Vitals:   11/26/17 0544  TempSrc: Oral      Patients Stated Pain Goal: 3 (78/67/67 2094)  Complications: No apparent anesthesia complications

## 2017-11-26 NOTE — Anesthesia Postprocedure Evaluation (Signed)
Anesthesia Post Note  Patient: Kristine Mccarthy  Procedure(s) Performed: LEFT BREAST LUMPECTOMY WITH RADIOACTIVE SEED LOCALIZATION ERAS PATHWAY (Left Breast)     Patient location during evaluation: PACU Anesthesia Type: General Level of consciousness: awake Pain management: pain level controlled Vital Signs Assessment: post-procedure vital signs reviewed and stable Respiratory status: spontaneous breathing Cardiovascular status: stable Postop Assessment: no apparent nausea or vomiting Anesthetic complications: no    Last Vitals:  Vitals:   11/26/17 0845 11/26/17 0900  BP: 121/83 116/73  Pulse: 95 78  Resp: 19 20  Temp:  36.5 C  SpO2: 96% 97%    Last Pain:  Vitals:   11/26/17 0900  TempSrc:   PainSc: 3    Pain Goal: Patients Stated Pain Goal: 3 (11/26/17 0600)               Taesean Reth JR,JOHN Shandy Vi

## 2017-11-26 NOTE — Op Note (Signed)
eoperative diagnosis: Left breast mass  Postoperative diagnosis: Same   Procedure: Left breast seed localized lumpectomy  Surgeon: Erroll Luna M.D.  Anesthesia: Gen. With 0.25% Sensorcaine local  EBL: 20 cc  Specimen: Left breast tissue with clip and radioactive seed in the specimen. Verified with neoprobe and radiographic image showing both seed and clip in specimen  Indications for procedure: The patient presents for left breast lumpectomy after core biopsy showed mass and complex sclerosing lesion . Discussed the rationale for considering lumpectomy.  Small risk of malignancy associated with this  lesion after core biopsy. Discussed observation. Discussed wire localization and seed use. . Patient desired lumpectomy.The procedure has been discussed with the patient. Alternatives to surgery have been discussed with the patient.  Risks of surgery include bleeding,  Infection,  Seroma formation, death,  and the need for further surgery.   The patient understands and wishes to proceed.   Description of procedure: Patient underwent seed placement as an outpatient. Patient presents today for left breast seed localized lumpectomy. Patient seen in the holding area. Questions are answered . Patient taken back to the operating room and placed upon the OR table. After induction of general anesthesia, left breast prepped and draped in a sterile fashion. Timeout was done to verify proper  procedure. Neoprobe used and hot spot identified and left breast upper-outer quadrant. This was marked with pen. Curvilinear incision made around the NAC of the breast. Dissection used with the help of a neoprobe around the tissue where the seed and clip were located. Tissue removed in its entirety with gross negative margins.. Neoprobe used and seed within specimen. Radiographs taken which show clip and seed  In the  specimen.Hemostasis achieved and cavity closed with 2-0 Vicryl and 4-0 Monocryl. Dermabond applied. All  final counts found to be correct. Specimen transported to pathology. Patient awoke extubated taken to recovery in satisfactory condition.

## 2017-11-26 NOTE — Discharge Instructions (Signed)
[facility] °Office Phone Number [phone] ° °BREAST BIOPSY/ PARTIAL MASTECTOMY: POST OP INSTRUCTIONS ° °Always review your discharge instruction sheet given to you by the facility where your surgery was performed. ° °IF YOU HAVE DISABILITY OR FAMILY LEAVE FORMS, YOU MUST BRING THEM TO THE OFFICE FOR PROCESSING.  DO NOT GIVE THEM TO YOUR DOCTOR. ° °1. A prescription for pain medication may be given to you upon discharge.  Take your pain medication as prescribed, if needed.  If narcotic pain medicine is not needed, then you may take acetaminophen (Tylenol) or ibuprofen (Advil) as needed. °2. Take your usually prescribed medications unless otherwise directed °3. If you need a refill on your pain medication, please contact your pharmacy.  They will contact our office to request authorization.  Prescriptions will not be filled after 5pm or on week-ends. °4. You should eat very light the first 24 hours after surgery, such as soup, crackers, pudding, etc.  Resume your normal diet the day after surgery. °5. Most patients will experience some swelling and bruising in the breast.  Ice packs and a good support bra will help.  Swelling and bruising can take several days to resolve.  °6. It is common to experience some constipation if taking pain medication after surgery.  Increasing fluid intake and taking a stool softener will usually help or prevent this problem from occurring.  A mild laxative (Milk of Magnesia or Miralax) should be taken according to package directions if there are no bowel movements after 48 hours. °7. Unless discharge instructions indicate otherwise, you may remove your bandages 24-48 hours after surgery, and you may shower at that time.  You may have steri-strips (small skin tapes) in place directly over the incision.  These strips should be left on the skin for 7-10 days.  If your surgeon used skin glue on the incision, you may shower in 24 hours.  The glue will flake off over the  next 2-3 weeks.  Any sutures or staples will be removed at the office during your follow-up visit. °8. ACTIVITIES:  You may resume regular daily activities (gradually increasing) beginning the next day.  Wearing a good support bra or sports bra minimizes pain and swelling.  You may have sexual intercourse when it is comfortable. °a. You may drive when you no longer are taking prescription pain medication, you can comfortably wear a seatbelt, and you can safely maneuver your car and apply brakes. °b. RETURN TO WORK:  ______________________________________________________________________________________ °9. You should see your doctor in the office for a follow-up appointment approximately two weeks after your surgery.  Your doctor’s nurse will typically make your follow-up appointment when she calls you with your pathology report.  Expect your pathology report 2-3 business days after your surgery.  You may call to check if you do not hear from us after three days. °10. OTHER INSTRUCTIONS: _______________________________________________________________________________________________ _____________________________________________________________________________________________________________________________________ °_____________________________________________________________________________________________________________________________________ °_____________________________________________________________________________________________________________________________________ ° °WHEN TO CALL YOUR DOCTOR: °1. Fever over 101.0 °2. Nausea and/or vomiting. °3. Extreme swelling or bruising. °4. Continued bleeding from incision. °5. Increased pain, redness, or drainage from the incision. ° °The clinic staff is available to answer your questions during regular business hours.  Please don’t hesitate to call and ask to speak to one of the nurses for clinical concerns.  If you have a medical emergency, go to the nearest  emergency room or call 911.  A surgeon from [facility] is always on call at the hospital. ° °For further questions, please visit [email or web address]  °

## 2017-11-27 ENCOUNTER — Encounter (HOSPITAL_COMMUNITY): Payer: Self-pay | Admitting: Surgery

## 2018-03-11 ENCOUNTER — Other Ambulatory Visit: Payer: Self-pay | Admitting: Obstetrics and Gynecology

## 2018-03-11 DIAGNOSIS — D241 Benign neoplasm of right breast: Secondary | ICD-10-CM

## 2018-03-11 DIAGNOSIS — D242 Benign neoplasm of left breast: Principal | ICD-10-CM

## 2018-04-18 ENCOUNTER — Ambulatory Visit
Admission: RE | Admit: 2018-04-18 | Discharge: 2018-04-18 | Disposition: A | Discharge status: Home / Self Care | Payer: No Typology Code available for payment source | Source: Ambulatory Visit | Attending: Obstetrics and Gynecology | Admitting: Obstetrics and Gynecology

## 2018-04-18 ENCOUNTER — Other Ambulatory Visit: Payer: Self-pay | Admitting: Obstetrics and Gynecology

## 2018-04-18 DIAGNOSIS — D241 Benign neoplasm of right breast: Secondary | ICD-10-CM

## 2018-04-18 DIAGNOSIS — D242 Benign neoplasm of left breast: Principal | ICD-10-CM

## 2018-04-18 DIAGNOSIS — Z1231 Encounter for screening mammogram for malignant neoplasm of breast: Secondary | ICD-10-CM

## 2018-04-18 DIAGNOSIS — N6310 Unspecified lump in the right breast, unspecified quadrant: Secondary | ICD-10-CM

## 2018-04-19 ENCOUNTER — Other Ambulatory Visit: Payer: Self-pay | Admitting: Obstetrics and Gynecology

## 2018-04-19 DIAGNOSIS — N63 Unspecified lump in unspecified breast: Secondary | ICD-10-CM

## 2018-10-24 ENCOUNTER — Other Ambulatory Visit: Payer: Self-pay

## 2019-02-03 ENCOUNTER — Telehealth: Payer: Self-pay | Admitting: *Deleted

## 2019-02-03 NOTE — Telephone Encounter (Signed)
Telephoned patient, left a message to return a call to [facility] regarding a mammo appointment.

## 2019-02-09 ENCOUNTER — Telehealth: Payer: Self-pay | Admitting: *Deleted

## 2019-02-09 NOTE — Telephone Encounter (Signed)
Telephoned patient, left a message to return a call to [place].

## 2019-02-13 ENCOUNTER — Telehealth (HOSPITAL_COMMUNITY): Payer: Self-pay

## 2019-02-13 NOTE — Telephone Encounter (Signed)
Left message with patient that I was returning her phone call that she had made to [place]. Left name and number for her to call back. We have reached out to the patient 3 times and have not gotten in touch with her.

## 2019-02-15 ENCOUNTER — Other Ambulatory Visit (HOSPITAL_COMMUNITY): Payer: Self-pay | Admitting: *Deleted

## 2019-02-15 DIAGNOSIS — N6310 Unspecified lump in the right breast, unspecified quadrant: Principal | ICD-10-CM

## 2019-02-15 DIAGNOSIS — N6315 Unspecified lump in the right breast, overlapping quadrants: Secondary | ICD-10-CM

## 2019-03-29 ENCOUNTER — Telehealth (HOSPITAL_COMMUNITY): Payer: Self-pay | Admitting: *Deleted

## 2019-03-29 NOTE — Telephone Encounter (Signed)
Telephoned patient at home number and left message at mobile number. Advised patient would need to speak with her prior to appointment otherwise would cancel appointment.

## 2019-03-30 ENCOUNTER — Ambulatory Visit (HOSPITAL_COMMUNITY): Payer: Self-pay

## 2019-03-30 ENCOUNTER — Other Ambulatory Visit: Payer: Self-pay

## 2019-04-24 ENCOUNTER — Encounter (HOSPITAL_COMMUNITY): Payer: Self-pay

## 2019-04-24 NOTE — Progress Notes (Signed)
Spoke with patient on the phone regarding BCCCP appointment on 04/25/2019. Asked patient [id number] screening questions. Patient answered no to all questions.

## 2019-04-25 ENCOUNTER — Other Ambulatory Visit: Payer: Self-pay

## 2019-04-25 ENCOUNTER — Encounter (HOSPITAL_COMMUNITY): Payer: Self-pay

## 2019-04-25 ENCOUNTER — Ambulatory Visit (HOSPITAL_COMMUNITY)
Admission: RE | Admit: 2019-04-25 | Discharge: 2019-04-25 | Disposition: A | Discharge status: Home / Self Care | Payer: Self-pay | Source: Ambulatory Visit | Attending: Obstetrics and Gynecology | Admitting: Obstetrics and Gynecology

## 2019-04-25 VITALS — BP 118/78 | Temp 98.3°F | Wt 156.0 lb

## 2019-04-25 DIAGNOSIS — Z1239 Encounter for other screening for malignant neoplasm of breast: Secondary | ICD-10-CM

## 2019-04-25 NOTE — Patient Instructions (Signed)
Explained breast self awareness with Kristine Mccarthy. Patient did not need a Pap smear today due to last Pap smear and HPV typing was 10/14/2017. Let her know B[facility]CP will cover Pap smears and HPV typing every 5 years unless has a history of abnormal Pap smears. Referred patient to the [facility] for a diagnostic mammogram per recommendation. Appointment scheduled for Thursday, Apr 27, 2019 at 0920. Patient aware of appointment and will be there. Discussed smoking cessation with patient. Referred patient to the [facility] Quitline and gave resources to free smoking cessation classes at [facility]. Kristine Mccarthy verbalized understanding.  Kristine Mccarthy, Arvil [place], RN 8:55 AM

## 2019-04-25 NOTE — Progress Notes (Signed)
Patient referred to [facility] by the [facility] due to a bilateral diagnostic mammogram was recommended in 68-months. Last diagnostic mammogram completed 10/14/2017.  Pap Smear: Pap smear not completed today. Last Pap smear was 10/14/2017 at [facility] and normal with negative HPV. Per patient has a history of three abnormal Pap smears with the first being 28 years ago and the last one 25 years ago. Patient stated she had a LEEP completed 25 years ago after her last abnormal Pap smear. Patient stated all Pap smears have been normal since LEEP. Last Pap smear result is in EPIC.  Physical exam: Breasts Breasts symmetrical. No skin abnormalities bilateral breasts. No nipple retraction bilateral breasts. No nipple discharge bilateral breasts. No lymphadenopathy. No lumps palpated bilateral breasts. No complaints of pain or tenderness on exam. Referred patient to the [facility] for a diagnostic mammogram per recommendation. Appointment scheduled for Thursday, Apr 27, 2019 at 0920.        Pelvic/Bimanual No Pap smear completed today since last Pap smear and HPV typing was 10/14/2017. Pap smear not indicated per BCCCP guidelines.   Smoking History: Patient is a current smoker. Discussed smoking cessation with patient. Referred patient to the [facility] Quitline and gave resources to free smoking cessation classes at [facility].  Patient Navigation: Patient education provided. Access to services provided for patient through BCCCP program.   Breast and Cervical Cancer Risk Assessment: Patient has a family history of two paternal aunts having breast cancer. Patient has no known genetic mutations or history of radiation treatment to the chest before age 62. Per patient has a history of cervical dysplasia. Patient has no history of being immunocompromised or DES exposure in-utero.  Risk Assessment    Risk Scores      04/25/2019   Last edited by: Armond Hang, LPN   5-year  risk: 1 %   Lifetime risk: 9.1 %

## 2019-04-27 ENCOUNTER — Other Ambulatory Visit: Payer: Self-pay

## 2019-05-02 ENCOUNTER — Ambulatory Visit (HOSPITAL_COMMUNITY): Payer: Self-pay

## 2019-05-08 ENCOUNTER — Encounter (HOSPITAL_COMMUNITY): Payer: Self-pay | Admitting: *Deleted

## 2019-05-09 ENCOUNTER — Other Ambulatory Visit: Payer: Self-pay

## 2019-05-31 ENCOUNTER — Ambulatory Visit
Admission: RE | Admit: 2019-05-31 | Discharge: 2019-05-31 | Disposition: A | Discharge status: Home / Self Care | Payer: No Typology Code available for payment source | Source: Ambulatory Visit | Attending: Obstetrics and Gynecology | Admitting: Obstetrics and Gynecology

## 2019-05-31 ENCOUNTER — Ambulatory Visit
Admission: RE | Admit: 2019-05-31 | Discharge: 2019-05-31 | Disposition: A | Discharge status: Home / Self Care | Payer: Self-pay | Source: Ambulatory Visit | Attending: Obstetrics and Gynecology | Admitting: Obstetrics and Gynecology

## 2019-05-31 ENCOUNTER — Other Ambulatory Visit: Payer: Self-pay

## 2019-05-31 DIAGNOSIS — N6315 Unspecified lump in the right breast, overlapping quadrants: Secondary | ICD-10-CM

## 2020-04-15 ENCOUNTER — Other Ambulatory Visit: Payer: Self-pay

## 2020-04-15 DIAGNOSIS — Z1231 Encounter for screening mammogram for malignant neoplasm of breast: Secondary | ICD-10-CM

## 2020-04-25 ENCOUNTER — Ambulatory Visit: Payer: Self-pay

## 2020-07-25 ENCOUNTER — Encounter (HOSPITAL_COMMUNITY): Payer: Self-pay

## 2020-07-25 ENCOUNTER — Ambulatory Visit (INDEPENDENT_AMBULATORY_CARE_PROVIDER_SITE_OTHER): Payer: No Typology Code available for payment source

## 2020-07-25 ENCOUNTER — Ambulatory Visit (HOSPITAL_COMMUNITY)
Admission: EM | Admit: 2020-07-25 | Discharge: 2020-07-25 | Disposition: A | Discharge status: Home / Self Care | Payer: No Typology Code available for payment source | Attending: Urgent Care | Admitting: Urgent Care

## 2020-07-25 ENCOUNTER — Other Ambulatory Visit: Payer: Self-pay

## 2020-07-25 DIAGNOSIS — M62838 Other muscle spasm: Secondary | ICD-10-CM

## 2020-07-25 DIAGNOSIS — M545 Low back pain, unspecified: Secondary | ICD-10-CM

## 2020-07-25 DIAGNOSIS — M419 Scoliosis, unspecified: Secondary | ICD-10-CM

## 2020-07-25 MED ORDER — TIZANIDINE HCL 4 MG PO TABS: 4.0000 mg | ORAL_TABLET | Freq: Three times a day (TID) | ORAL | 0 refills | Status: DC | PRN

## 2020-07-25 MED ORDER — NAPROXEN 500 MG PO TABS: 500.0000 mg | ORAL_TABLET | Freq: Two times a day (BID) | ORAL | 0 refills | Status: DC

## 2020-07-25 MED ORDER — HYDROCODONE-ACETAMINOPHEN 5-325 MG PO TABS
1.0000 | ORAL_TABLET | Freq: Once | ORAL | Status: AC
  Administered 2020-07-25: 1 via ORAL

## 2020-07-25 MED ORDER — HYDROCODONE-ACETAMINOPHEN 5-325 MG PO TABS
ORAL_TABLET | ORAL | Status: AC
  Filled 2020-07-25: qty 1

## 2020-07-25 MED ORDER — TRAMADOL HCL 50 MG PO TABS: 50.0000 mg | ORAL_TABLET | Freq: Four times a day (QID) | ORAL | 0 refills | Status: DC | PRN

## 2020-07-25 NOTE — ED Triage Notes (Signed)
Pt c/o pain to low back and L hip x 4 weeks, has been taking ibuprofen at home but is not helping the last 3 days. Denies injury, hx of scoliosis

## 2020-07-25 NOTE — ED Provider Notes (Signed)
[facility]   MRN: [id number] DOB: Feb 22, 1971  Subjective:   Kristine Mccarthy is a 49 y.o. female presenting for 4-week history of persistent and worsening low back pain. Patient has a history of severe scoliosis and was supposed to wear a back brace but did not end up doing this in her teenage years. States that she has not followed up with a back specialist since then. Denies falls, trauma, weakness, numbness, tingling.  No current facility-administered medications for this encounter.  Current Outpatient Medications:  .  albuterol (PROVENTIL HFA;VENTOLIN HFA) 108 (90 Base) MCG/ACT inhaler, Inhale 2 puffs into the lungs every 6 (six) hours as needed for wheezing or shortness of breath., Disp: , Rfl:  .  ibuprofen (ADVIL,MOTRIN) 200 MG tablet, Take 400-800 mg by mouth every 6 (six) hours as needed for headache or moderate pain., Disp: , Rfl:  .  ibuprofen (ADVIL,MOTRIN) 800 MG tablet, Take 1 tablet (800 mg total) by mouth every 8 (eight) hours as needed. (Patient not taking: Reported on 04/25/2019), Disp: 30 tablet, Rfl: 0 .  oxyCODONE (OXY IR/ROXICODONE) 5 MG immediate release tablet, Take 1-2 tablets (5-10 mg total) by mouth every 6 (six) hours as needed for severe pain. (Patient not taking: Reported on 04/25/2019), Disp: 10 tablet, Rfl: 0   Allergies  Allergen Reactions  . Morphine And Related Nausea And Vomiting    Past Medical History:  Diagnosis Date  . Abnormal Pap smear 1992   Leep, biopsy  . Arthritis   . Breast mass in female    Left  . Bronchitis   . Cervical dysplasia    level 3  . PID (acute pelvic inflammatory disease)   . Scoliosis   . Substance abuse ([place])   . Urinary tract infection      Past Surgical History:  Procedure Laterality Date  . BREAST EXCISIONAL BIOPSY Left 11/26/2017   sclerosing lesion  . BREAST LUMPECTOMY WITH RADIOACTIVE SEED LOCALIZATION Left 11/26/2017   Procedure: LEFT BREAST LUMPECTOMY WITH RADIOACTIVE SEED LOCALIZATION ERAS  PATHWAY;  Surgeon: Erroll Luna, MD;  Location: [facility];  Service: General;  Laterality: Left;  . BREAST SURGERY    . CERVICAL BIOPSY  W/ LOOP ELECTRODE EXCISION     oever 26 years ago  . COLPOSCOPY     over 26 years ago  . DILATION AND CURETTAGE OF UTERUS    . INDUCED ABORTION    . LEEP      Family History  Problem Relation Age of Onset  . Cancer Maternal Grandmother        ovarian  . Heart disease Maternal Grandmother   . Heart disease Father   . Breast cancer Paternal Aunt     Social History   Tobacco Use  . Smoking status: Current Every Day Smoker    Packs/day: 1.00    Years: 26.00    Pack years: 26.00    Types: Cigarettes  . Smokeless tobacco: Never Used  . Tobacco comment: pt states will quit b/c she is pregnant  Vaping Use  . Vaping Use: Never used  Substance Use Topics  . Alcohol use: Yes    Comment: once or twice a month beer  . Drug use: Yes    Types: Cocaine, Marijuana    Comment: last used 5 to 6 years ago    ROS   Objective:   Vitals: BP (!) 130/95   Pulse 84   Temp 97.8 F (36.6 C)   Resp 16   LMP 07/25/2020  SpO2 98%   Physical Exam Constitutional:      General: She is not in acute distress.    Appearance: Normal appearance. She is well-developed. She is not ill-appearing, toxic-appearing or diaphoretic.  HENT:     Head: Normocephalic and atraumatic.     Nose: Nose normal.     Mouth/Throat:     Mouth: Mucous membranes are moist.     Pharynx: Oropharynx is clear.  Eyes:     General: No scleral icterus.    Extraocular Movements: Extraocular movements intact.     Pupils: Pupils are equal, round, and reactive to light.  Cardiovascular:     Rate and Rhythm: Normal rate.  Pulmonary:     Effort: Pulmonary effort is normal.  Musculoskeletal:     Lumbar back: Spasms and tenderness (along area outlined) present. No swelling, edema, deformity, signs of trauma, lacerations or bony tenderness. Decreased range of motion. Negative right  straight leg raise test and negative left straight leg raise test. Scoliosis present.       Back:  Skin:    General: Skin is warm and dry.  Neurological:     General: No focal deficit present.     Mental Status: She is alert and oriented to person, place, and time.     Motor: No weakness.     Coordination: Coordination abnormal.     Gait: Gait normal.     Deep Tendon Reflexes: Reflexes normal.  Psychiatric:        Mood and Affect: Mood normal.        Behavior: Behavior normal.        Thought Content: Thought content normal.        Judgment: Judgment normal.     DG Lumbar Spine Complete  Result Date: 07/25/2020 CLINICAL DATA:  Back pain, scoliosis EXAM: LUMBAR SPINE - COMPLETE 4+ VIEW COMPARISON:  None. FINDINGS: There is slight asymmetric lateral wedging of the L2 vertebral body likely related to the dextrocurvature of the spine with an apex at the L2 level. Appears chronic/remote. No definite acute fracture vertebral body height loss is evident though evaluation may be limited by the curvature of the spine. Normal bone mineralization. No worrisome osseous lesions. Mild degenerative changes in the hips and pelvis. IMPRESSION: 1. Mild asymmetric lateral wedging of the L2 vertebral body likely related to the dextrocurvature of the spine. 2. No definite acute fracture or vertebral body height loss is evident though evaluation may be limited by the scoliotic curvature. Electronically Signed   By: Lovena Le M.D.   On: 07/25/2020 17:03    Assessment and Plan :   I have reviewed the PDMP during this encounter.  1. Acute left-sided low back pain without sciatica   2. Muscle spasm   3. Scoliosis of thoracolumbar spine, unspecified scoliosis type     No acute processes seen on x-ray. Emphasized need for follow up with neurospine specialist. Use naproxen, tizanidine in the meantime. Tramadol for breakthrough pain.  Counseled patient on potential for adverse effects with medications  prescribed/recommended today, ER and return-to-clinic precautions discussed, patient verbalized understanding.    Jaynee Eagles, [place] 07/25/20 1709

## 2020-12-16 IMAGING — DX DG LUMBAR SPINE COMPLETE 4+V
5 series · 5 of 5 positions shown · non-contrast
Comparison: None.

CLINICAL DATA: Back pain, scoliosis

EXAM:
LUMBAR SPINE - COMPLETE 4+ VIEW

[l-spine ap]
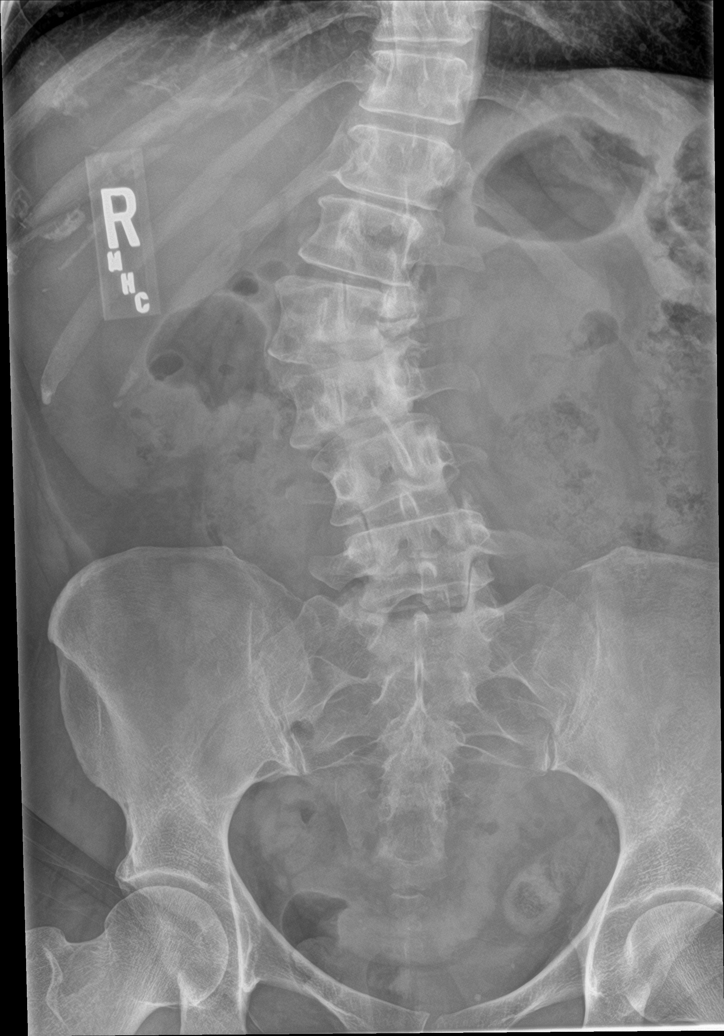

[l-spine obl (1 of 2)]
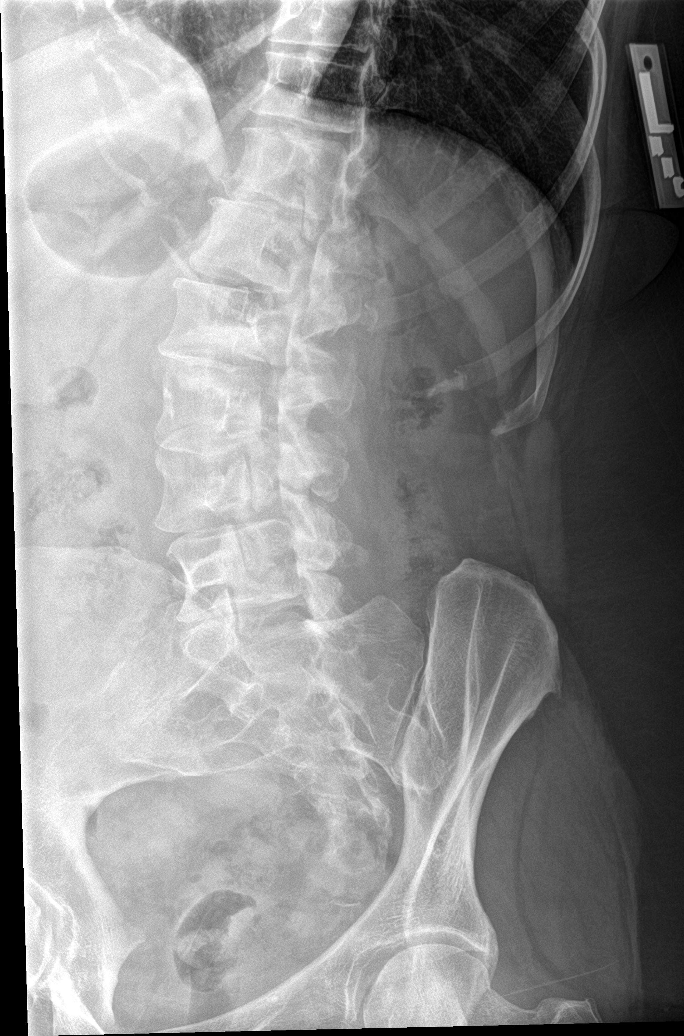

[l-spine obl (2 of 2)]
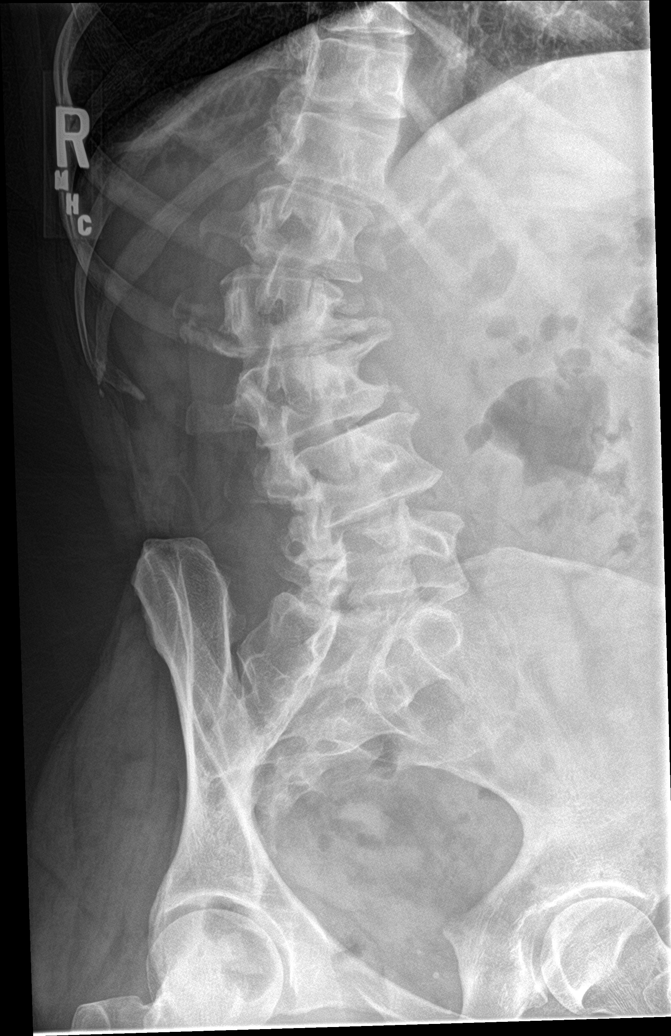

[l-spine lat]
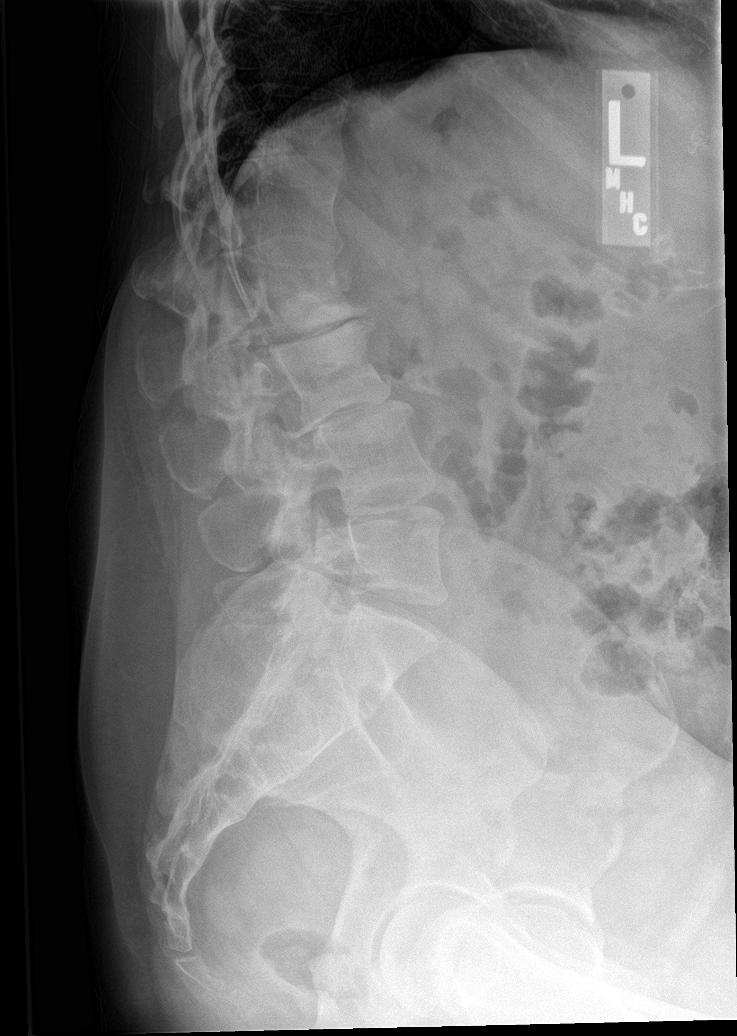

[l-spine spot]
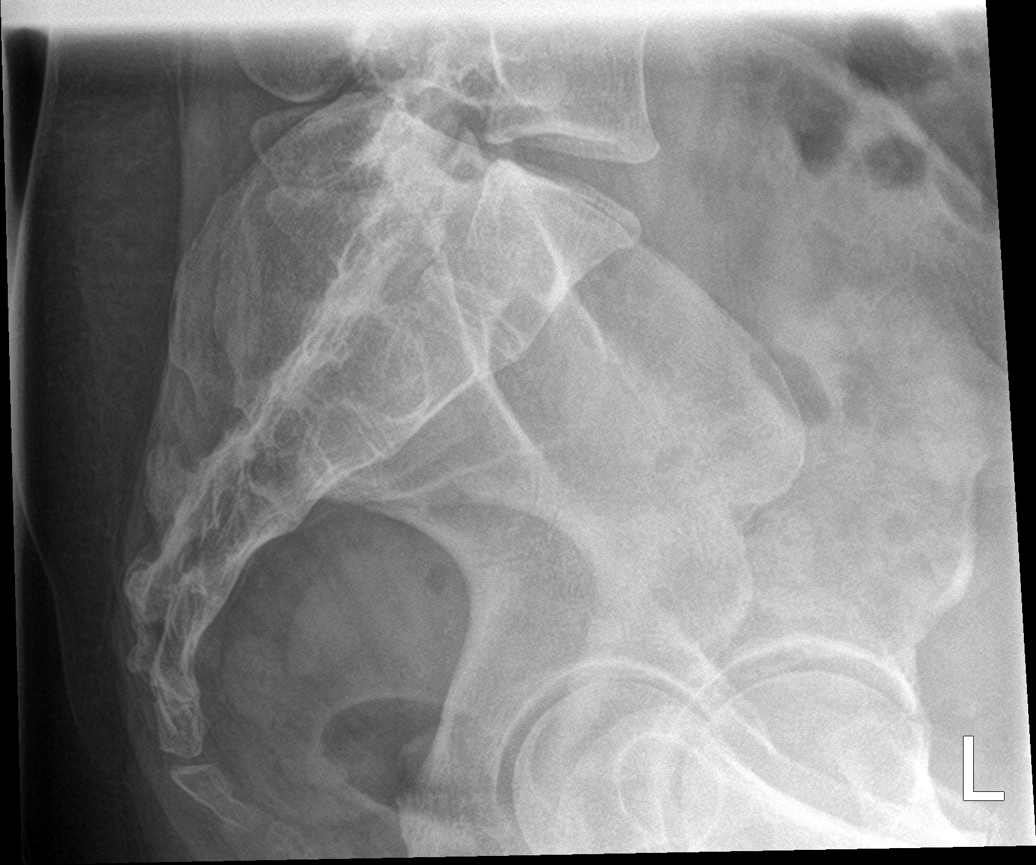

[5 of 5 positions shown; findings below may reference images not displayed]

FINDINGS: There is slight asymmetric lateral wedging of the L2 vertebral body
likely related to the dextrocurvature of the spine with an apex at
the L2 level. Appears chronic/remote. No definite acute fracture
vertebral body height loss is evident though evaluation may be
limited by the curvature of the spine. Normal bone mineralization.
No worrisome osseous lesions. Mild degenerative changes in the hips
and pelvis.
IMPRESSION: 1. Mild asymmetric lateral wedging of the L2 vertebral body likely
related to the dextrocurvature of the spine.
2. No definite acute fracture or vertebral body height loss is
evident though evaluation may be limited by the scoliotic curvature.

## 2021-03-17 ENCOUNTER — Ambulatory Visit (HOSPITAL_COMMUNITY)
Admission: EM | Admit: 2021-03-17 | Discharge: 2021-03-17 | Disposition: A | Discharge status: Home / Self Care | Payer: No Typology Code available for payment source

## 2021-03-17 ENCOUNTER — Encounter (HOSPITAL_COMMUNITY): Payer: Self-pay | Admitting: Emergency Medicine

## 2021-03-17 ENCOUNTER — Other Ambulatory Visit: Payer: Self-pay

## 2021-03-17 DIAGNOSIS — M25562 Pain in left knee: Secondary | ICD-10-CM

## 2021-03-17 NOTE — ED Triage Notes (Signed)
Pt presents with left side knee pain and swelling xs 3-4 months. States pain and swelling comes and goes.

## 2021-03-17 NOTE — Discharge Instructions (Signed)
Take Ibuprofen as needed for pain.  Use Voltaren gel as needed for knee pain.   You can use RICE.  RICE: Rest Ice for 10-15 minutes every 4-6 hours as needed for pain and swelling or use Heat Compression Elevation above your hip  Get established with a Primary Care provider and follow up for long term management.   If you notice increased swelling, redness, warmth, or red streaks, go directly to the Emergency Department.

## 2021-03-17 NOTE — ED Provider Notes (Signed)
[place]    CSN: [id number] Arrival date & time: 03/17/21  1007      History   Chief Complaint Chief Complaint  Patient presents with  . Knee Pain    Left    HPI Kristine Mccarthy is a 50 y.o. female.   Kristine Mccarthy is a 50 y.o. here with chief complaint of left knee pain and swelling.  Reports pain is achy and swelling have been ongoing for the past several months and is intermittent.  Denies any pain/swelling today.  Reports taking Ibuprofen as needed for pain which does help.  Reports pain and swelling worse with movement and improve with rest.  Denies any fevers, chest pain, shortness of breath, N/V/D, numbness, tingling, weakness, abdominal pain, or headaches.   ROS: As per HPI, all other pertinent ROS negative    The history is provided by the patient.  Knee Pain   Past Medical History:  Diagnosis Date  . Abnormal Pap smear 1992   Leep, biopsy  . Arthritis   . Breast mass in female    Left  . Bronchitis   . Cervical dysplasia    level 3  . PID (acute pelvic inflammatory disease)   . Scoliosis   . Substance abuse ([place])   . Urinary tract infection     Patient Active Problem List   Diagnosis Date Noted  . Ectopic pregnancy 10/10/2011    Past Surgical History:  Procedure Laterality Date  . BREAST EXCISIONAL BIOPSY Left 11/26/2017   sclerosing lesion  . BREAST LUMPECTOMY WITH RADIOACTIVE SEED LOCALIZATION Left 11/26/2017   Procedure: LEFT BREAST LUMPECTOMY WITH RADIOACTIVE SEED LOCALIZATION ERAS PATHWAY;  Surgeon: Erroll Luna, MD;  Location: [facility];  Service: General;  Laterality: Left;  . BREAST SURGERY    . CERVICAL BIOPSY  W/ LOOP ELECTRODE EXCISION     oever 26 years ago  . COLPOSCOPY     over 26 years ago  . DILATION AND CURETTAGE OF UTERUS    . INDUCED ABORTION    . LEEP      OB History    Gravida  5   Para  2   Term  2   Preterm  0   AB  3   Living  2     SAB  1   IAB  1   Ectopic  1   Multiple  0    Live Births  2            Home Medications    Prior to Admission medications   Medication Sig Start Date End Date Taking? Authorizing Provider  albuterol (PROVENTIL HFA;VENTOLIN HFA) 108 (90 Base) MCG/ACT inhaler Inhale 2 puffs into the lungs every 6 (six) hours as needed for wheezing or shortness of breath.    [provider]  ibuprofen (ADVIL,MOTRIN) 200 MG tablet Take 400-800 mg by mouth every 6 (six) hours as needed for headache or moderate pain.    [provider]  ibuprofen (ADVIL,MOTRIN) 800 MG tablet Take 1 tablet (800 mg total) by mouth every 8 (eight) hours as needed. Patient not taking: Reported on 04/25/2019 11/26/17   Erroll Luna, MD  naproxen (NAPROSYN) 500 MG tablet Take 1 tablet (500 mg total) by mouth 2 (two) times daily with a meal. 07/25/20   Jaynee Eagles, PA-C  oxyCODONE (OXY IR/ROXICODONE) 5 MG immediate release tablet Take 1-2 tablets (5-10 mg total) by mouth every 6 (six) hours as needed for severe pain. Patient not  taking: Reported on 04/25/2019 11/26/17   Erroll Luna, MD  tiZANidine (ZANAFLEX) 4 MG tablet Take 1 tablet (4 mg total) by mouth every 8 (eight) hours as needed. 07/25/20   Jaynee Eagles, PA-C  traMADol (ULTRAM) 50 MG tablet Take 1 tablet (50 mg total) by mouth every 6 (six) hours as needed. 07/25/20   Jaynee Eagles, PA-C    Family History Family History  Problem Relation Age of Onset  . Cancer Maternal Grandmother        ovarian  . Heart disease Maternal Grandmother   . Heart disease Father   . Breast cancer Paternal Aunt     Social History Social History   Tobacco Use  . Smoking status: Current Every Day Smoker    Packs/day: 1.00    Years: 26.00    Pack years: 26.00    Types: Cigarettes  . Smokeless tobacco: Never Used  . Tobacco comment: pt states will quit b/c she is pregnant  Vaping Use  . Vaping Use: Never used  Substance Use Topics  . Alcohol use: Yes    Comment: once or twice a month beer  . Drug use: Yes    Types:  Cocaine, Marijuana    Comment: last used 5 to 6 years ago     Allergies   Morphine and related   Review of Systems Review of Systems  Musculoskeletal: Positive for arthralgias and joint swelling.  All other systems reviewed and are negative.    Physical Exam Triage Vital Signs ED Triage Vitals  Enc Vitals Group     BP 03/17/21 1041 117/78     Pulse Rate 03/17/21 1041 72     Resp 03/17/21 1041 18     Temp 03/17/21 1041 97.8 F (36.6 C)     Temp Source 03/17/21 1041 Oral     SpO2 03/17/21 1041 97 %     Weight --      Height --      Head Circumference --      Peak Flow --      Pain Score 03/17/21 1040 3     Pain Loc --      Pain Edu? --      Excl. in [place]? --    No data found.  Updated Vital Signs BP 117/78 (BP Location: Right Arm)   Pulse 72   Temp 97.8 F (36.6 C) (Oral)   Resp 18   SpO2 97%   Visual Acuity Right Eye Distance:   Left Eye Distance:   Bilateral Distance:    Right Eye Near:   Left Eye Near:    Bilateral Near:     Physical Exam Vitals and nursing note reviewed.  Constitutional:      General: She is not in acute distress.    Appearance: Normal appearance. She is not ill-appearing, toxic-appearing or diaphoretic.  HENT:     Head: Normocephalic and atraumatic.  Eyes:     Conjunctiva/sclera: Conjunctivae normal.  Cardiovascular:     Rate and Rhythm: Normal rate.     Pulses: Normal pulses.  Pulmonary:     Effort: Pulmonary effort is normal.  Abdominal:     General: Abdomen is flat.  Musculoskeletal:        General: Normal range of motion.     Cervical back: Normal range of motion.     Right knee: Normal.     Left knee: No swelling, deformity, effusion, erythema or ecchymosis. Normal range of motion. No tenderness. Normal pulse.  Skin:    General: Skin is warm and dry.  Neurological:     General: No focal deficit present.     Mental Status: She is alert and oriented to person, place, and time.  Psychiatric:        Mood and Affect:  Mood normal.      UC Treatments / Results  Labs (all labs ordered are listed, but only abnormal results are displayed) Labs Reviewed - No data to display  EKG   Radiology No results found.  Procedures Procedures (including critical care time)  Medications Ordered in UC Medications - No data to display  Initial Impression / Assessment and Plan / UC Course  I have reviewed the triage vital signs and the nursing notes.  Pertinent labs & imaging results that were available during my care of the patient were reviewed by me and considered in my medical decision making (see chart for details).     Left knee pain Assessment negative for red flags or concerns, including joint effusions.  Most likely arthritis- patient reports family history Ibuprofen and Voltaren gel as needed.   Rest, Heat/Ice, and elevation for comfort.  Get established with PCP.  May require referral to ortho.  ED follow up as needed.   Final Clinical Impressions(s) / UC Diagnoses   Final diagnoses:  Left knee pain, unspecified chronicity     Discharge Instructions     Take Ibuprofen as needed for pain.  Use Voltaren gel as needed for knee pain.   You can use RICE.  RICE: Rest Ice for 10-15 minutes every 4-6 hours as needed for pain and swelling or use Heat Compression Elevation above your hip  Get established with a Primary Care provider and follow up for long term management.   If you notice increased swelling, redness, warmth, or red streaks, go directly to the Emergency Department.     ED Prescriptions    None     PDMP not reviewed this encounter.   Pearson Forster, NP 03/17/21 1106

## 2021-12-22 ENCOUNTER — Encounter: Payer: Self-pay | Admitting: Emergency Medicine

## 2021-12-22 ENCOUNTER — Ambulatory Visit
Admission: EM | Admit: 2021-12-22 | Discharge: 2021-12-22 | Disposition: A | Discharge status: Home / Self Care | Payer: No Typology Code available for payment source | Attending: Physician Assistant | Admitting: Physician Assistant

## 2021-12-22 ENCOUNTER — Other Ambulatory Visit: Payer: Self-pay

## 2021-12-22 DIAGNOSIS — J0140 Acute pansinusitis, unspecified: Secondary | ICD-10-CM

## 2021-12-22 MED ORDER — AMOXICILLIN-POT CLAVULANATE 875-125 MG PO TABS: 1.0000 | ORAL_TABLET | Freq: Two times a day (BID) | ORAL | 0 refills | Status: DC

## 2021-12-22 NOTE — ED Triage Notes (Signed)
Symptoms x 1 week. Facial pressure, sinus pain, nasal congestion. Says it started out with itching ears and throat with a mild cough and progressed into this with yellow/green mucus

## 2021-12-22 NOTE — ED Provider Notes (Signed)
[facility]    CSN: [id number] Arrival date & time: 12/22/21  1025      History   Chief Complaint Chief Complaint  Patient presents with   Facial Pain    HPI Kristine Mccarthy is a 51 y.o. female.   Patient here today for evaluation of facial pain and pressure, nasal congestion that started 1 weeks ago. She reports that her mucus has changed to a yellow green color. She did initially have scratchy throat and itchy ears but this has improved. She has had some cough. She has tried multiple OTC meds without significant improvement.  The history is provided by the patient.   Past Medical History:  Diagnosis Date   Abnormal Pap smear 1992   Leep, biopsy   Arthritis    Breast mass in female    Left   Bronchitis    Cervical dysplasia    level 3   PID (acute pelvic inflammatory disease)    Scoliosis    Substance abuse ([place])    Urinary tract infection     Patient Active Problem List   Diagnosis Date Noted   Ectopic pregnancy 10/10/2011    Past Surgical History:  Procedure Laterality Date   BREAST EXCISIONAL BIOPSY Left 11/26/2017   sclerosing lesion   BREAST LUMPECTOMY WITH RADIOACTIVE SEED LOCALIZATION Left 11/26/2017   Procedure: LEFT BREAST LUMPECTOMY WITH RADIOACTIVE SEED LOCALIZATION ERAS PATHWAY;  Surgeon: Erroll Luna, MD;  Location: [facility];  Service: General;  Laterality: Left;   BREAST SURGERY     CERVICAL BIOPSY  W/ LOOP ELECTRODE EXCISION     oever 26 years ago   COLPOSCOPY     over 26 years ago   DILATION AND CURETTAGE OF UTERUS     INDUCED ABORTION     LEEP      OB History     Gravida  5   Para  2   Term  2   Preterm  0   AB  3   Living  2      SAB  1   IAB  1   Ectopic  1   Multiple  0   Live Births  2            Home Medications    Prior to Admission medications   Medication Sig Start Date End Date Taking? Authorizing Provider  amoxicillin-clavulanate (AUGMENTIN) 875-125 MG tablet Take 1 tablet by mouth  every 12 (twelve) hours. 12/22/21  Yes Francene Finders, PA-C  albuterol (PROVENTIL HFA;VENTOLIN HFA) 108 (90 Base) MCG/ACT inhaler Inhale 2 puffs into the lungs every 6 (six) hours as needed for wheezing or shortness of breath.    [provider]  ibuprofen (ADVIL,MOTRIN) 200 MG tablet Take 400-800 mg by mouth every 6 (six) hours as needed for headache or moderate pain.    [provider]  ibuprofen (ADVIL,MOTRIN) 800 MG tablet Take 1 tablet (800 mg total) by mouth every 8 (eight) hours as needed. Patient not taking: Reported on 04/25/2019 11/26/17   Erroll Luna, MD  naproxen (NAPROSYN) 500 MG tablet Take 1 tablet (500 mg total) by mouth 2 (two) times daily with a meal. 07/25/20   Jaynee Eagles, PA-C  oxyCODONE (OXY IR/ROXICODONE) 5 MG immediate release tablet Take 1-2 tablets (5-10 mg total) by mouth every 6 (six) hours as needed for severe pain. Patient not taking: Reported on 04/25/2019 11/26/17   Erroll Luna, MD  tiZANidine (ZANAFLEX) 4 MG tablet Take 1 tablet (4  mg total) by mouth every 8 (eight) hours as needed. 07/25/20   Jaynee Eagles, PA-C  traMADol (ULTRAM) 50 MG tablet Take 1 tablet (50 mg total) by mouth every 6 (six) hours as needed. 07/25/20   Jaynee Eagles, PA-C    Family History Family History  Problem Relation Age of Onset   Cancer Maternal Grandmother        ovarian   Heart disease Maternal Grandmother    Heart disease Father    Breast cancer Paternal Aunt     Social History Social History   Tobacco Use   Smoking status: Every Day    Packs/day: 1.00    Years: 26.00    Pack years: 26.00    Types: Cigarettes   Smokeless tobacco: Never   Tobacco comments:    pt states will quit b/c she is pregnant  Vaping Use   Vaping Use: Never used  Substance Use Topics   Alcohol use: Yes    Comment: once or twice a month beer   Drug use: Yes    Types: Cocaine, Marijuana    Comment: last used 5 to 6 years ago     Allergies   Morphine and related   Review of  Systems Review of Systems  Constitutional:  Negative for chills and fever.  HENT:  Positive for congestion and sinus pressure. Negative for ear pain and sore throat.   Eyes:  Negative for discharge and redness.  Respiratory:  Positive for cough. Negative for shortness of breath and wheezing.   Gastrointestinal:  Negative for abdominal pain, diarrhea, nausea and vomiting.    Physical Exam Triage Vital Signs ED Triage Vitals  Enc Vitals Group     BP 12/22/21 1059 120/79     Pulse Rate 12/22/21 1059 75     Resp 12/22/21 1059 16     Temp 12/22/21 1059 98.4 F (36.9 C)     Temp Source 12/22/21 1059 Oral     SpO2 12/22/21 1059 95 %     Weight --      Height --      Head Circumference --      Peak Flow --      Pain Score 12/22/21 1101 8     Pain Loc --      Pain Edu? --      Excl. in [place]? --    No data found.  Updated Vital Signs BP 120/79 (BP Location: Right Arm)    Pulse 75    Temp 98.4 F (36.9 C) (Oral)    Resp 16    LMP 07/25/2020    SpO2 95%     Physical Exam Vitals and nursing note reviewed.  Constitutional:      General: She is not in acute distress.    Appearance: Normal appearance. She is not ill-appearing.  HENT:     Head: Normocephalic and atraumatic.     Right Ear: Tympanic membrane normal.     Left Ear: Tympanic membrane normal.     Nose: Congestion present.     Mouth/Throat:     Mouth: Mucous membranes are moist.     Pharynx: No oropharyngeal exudate or posterior oropharyngeal erythema.  Eyes:     Conjunctiva/sclera: Conjunctivae normal.  Cardiovascular:     Rate and Rhythm: Normal rate and regular rhythm.     Heart sounds: Normal heart sounds. No murmur heard. Pulmonary:     Effort: Pulmonary effort is normal. No respiratory distress.     Breath sounds:  Normal breath sounds. No wheezing, rhonchi or rales.  Skin:    General: Skin is warm and dry.  Neurological:     Mental Status: She is alert.  Psychiatric:        Mood and Affect: Mood normal.         Thought Content: Thought content normal.     UC Treatments / Results  Labs (all labs ordered are listed, but only abnormal results are displayed) Labs Reviewed - No data to display  EKG   Radiology No results found.  Procedures Procedures (including critical care time)  Medications Ordered in UC Medications - No data to display  Initial Impression / Assessment and Plan / UC Course  I have reviewed the triage vital signs and the nursing notes.  Pertinent labs & imaging results that were available during my care of the patient were reviewed by me and considered in my medical decision making (see chart for details).    Augmentin prescribed to cover sinusitis. Recommended follow up with any further concerns.   Final Clinical Impressions(s) / UC Diagnoses   Final diagnoses:  Acute pansinusitis, recurrence not specified   Discharge Instructions   None    ED Prescriptions     Medication Sig Dispense Auth. Provider   amoxicillin-clavulanate (AUGMENTIN) 875-125 MG tablet Take 1 tablet by mouth every 12 (twelve) hours. 14 tablet Francene Finders, PA-C      PDMP not reviewed this encounter.   Francene Finders, PA-C 12/22/21 1229

## 2022-03-04 ENCOUNTER — Other Ambulatory Visit: Payer: Self-pay

## 2022-03-04 NOTE — Progress Notes (Signed)
? ?New Patient Office Visit ? ?Subjective:  ?Patient ID: Kristine Mccarthy, female    DOB: May 06, 1971  Age: 51 y.o. MRN: [id number] ? ?CC:  ?Chief Complaint  ?Patient presents with  ? Establish Care  ?  Np. Est care. Chronic health maintenance for scoliosis.   ? ? ?HPI ?Kristine Mccarthy presents for new patient visit to establish care.  Introduced to Designer, jewellery role and practice setting.  All questions answered.  Discussed provider/patient relationship and expectations. She just got health insurance.  ? ?She has a history of chronic back pain. Endorses a burning pain to right lower back that does not radiate. Pain 12/10. Hard to sleep at night. Standing makes it worse. She was diagnosed with scoliosis at age 39. She didn't wear the brace like she was supposed. She states that it's painful and burning. The pain is not constant, but is worse with movement.  She would like a referral to neurosurgery. ? ?She has a history of a left breast lump with lumpectomy.  She has been having normal mammograms since. ? ?Depression and anxiety screening done today: ? ?Depression screen [facility] 2/9 03/05/2022  ?Decreased Interest 0  ?Down, Depressed, Hopeless 1  ?PHQ - 2 Score 1  ?Altered sleeping 3  ?Tired, decreased energy 1  ?Change in appetite 0  ?Feeling bad or failure about yourself  0  ?Trouble concentrating 0  ?Moving slowly or fidgety/restless 0  ?Suicidal thoughts 0  ?PHQ-9 Score 5  ?Difficult doing work/chores Somewhat difficult  ? ?GAD 7 : Generalized Anxiety Score 03/05/2022  ?Nervous, Anxious, on Edge 1  ?Control/stop worrying 0  ?Worry too much - different things 1  ?Trouble relaxing 3  ?Restless 1  ?Easily annoyed or irritable 3  ?Afraid - awful might happen 0  ?Total GAD 7 Score 9  ?Anxiety Difficulty Somewhat difficult  ? ? ?Past Medical History:  ?Diagnosis Date  ? Abnormal Pap smear 1992  ? Leep, biopsy  ? Anxiety   ? Arthritis   ? Breast mass in female   ? Left  ? Bronchitis   ? Cervical dysplasia   ? level 3  ?  PID (acute pelvic inflammatory disease)   ? Scoliosis   ? Substance abuse ([place])   ? Urinary tract infection   ? ? ?Past Surgical History:  ?Procedure Laterality Date  ? BREAST EXCISIONAL BIOPSY Left 11/26/2017  ? sclerosing lesion  ? BREAST LUMPECTOMY WITH RADIOACTIVE SEED LOCALIZATION Left 11/26/2017  ? Procedure: LEFT BREAST LUMPECTOMY WITH RADIOACTIVE SEED LOCALIZATION ERAS PATHWAY;  Surgeon: Erroll Luna, MD;  Location: [facility];  Service: General;  Laterality: Left;  ? BREAST SURGERY    ? CERVICAL BIOPSY  W/ LOOP ELECTRODE EXCISION    ? oever 26 years ago  ? COLPOSCOPY    ? over 26 years ago  ? DILATION AND CURETTAGE OF UTERUS    ? INDUCED ABORTION    ? LEEP    ? ? ?Family History  ?Problem Relation Age of Onset  ? Cancer Maternal Grandmother   ?     ovarian  ? Heart disease Maternal Grandmother   ? Heart disease Father   ? Breast cancer Paternal Aunt   ? ? ?Social History  ? ?Socioeconomic History  ? Marital status: Divorced  ?  Spouse name: Not on file  ? Number of children: 2  ? Years of education: Not on file  ? Highest education level: Some college, no degree  ?Occupational History  ? Not on  file  ?Tobacco Use  ? Smoking status: Every Day  ?  Packs/day: 1.00  ?  Years: 26.00  ?  Pack years: 26.00  ?  Types: Cigarettes  ? Smokeless tobacco: Never  ? Tobacco comments:  ?  pt states will quit b/c she is pregnant  ?Vaping Use  ? Vaping Use: Never used  ?Substance and Sexual Activity  ? Alcohol use: Yes  ?  Comment: once or twice a month beer  ? Drug use: Yes  ?  Types: Cocaine, Marijuana  ?  Comment: last used 5 to 6 years ago  ? Sexual activity: Yes  ?  Birth control/protection: None, Condom  ?Other Topics Concern  ? Not on file  ?Social History Narrative  ? Not on file  ? ?Social Determinants of Health  ? ?Financial Resource Strain: Not on file  ?Food Insecurity: Not on file  ?Transportation Needs: Not on file  ?Physical Activity: Not on file  ?Stress: Not on file  ?Social Connections: Not on file  ?Intimate  Partner Violence: Not on file  ? ? ?ROS ?Review of Systems  ?Constitutional:  Positive for fatigue.  ?HENT: Negative.    ?Eyes: Negative.   ?Respiratory: Negative.    ?Cardiovascular: Negative.   ?Genitourinary: Negative.   ?Musculoskeletal:  Positive for back pain.  ?Skin: Negative.   ?Neurological:  Positive for dizziness (sometimes when blood pressure low).  ?Psychiatric/Behavioral:  The patient is nervous/anxious.   ? ?Objective:  ? ?Today's Vitals: BP 90/60 (BP Location: Right Arm, Patient Position: Sitting, Cuff Size: Normal)   Pulse 76   Temp 97.6 ?F (36.4 ?C) (Temporal)   Ht '5\' 2"'$  (1.575 m)   Wt 138 lb (62.6 kg)   LMP 07/25/2020   SpO2 95%   BMI 25.24 kg/m?  ? ?Physical Exam ?Vitals and nursing note reviewed.  ?Constitutional:   ?   General: She is not in acute distress. ?   Appearance: Normal appearance.  ?HENT:  ?   Head: Normocephalic.  ?Eyes:  ?   Conjunctiva/sclera: Conjunctivae normal.  ?Cardiovascular:  ?   Rate and Rhythm: Normal rate and regular rhythm.  ?   Pulses: Normal pulses.  ?   Heart sounds: Normal heart sounds.  ?Pulmonary:  ?   Effort: Pulmonary effort is normal.  ?   Breath sounds: Normal breath sounds.  ?Abdominal:  ?   Palpations: Abdomen is soft.  ?   Tenderness: There is no abdominal tenderness.  ?Musculoskeletal:     ?   General: Normal range of motion.  ?   Cervical back: Normal range of motion.  ?   Right lower leg: No edema.  ?   Left lower leg: No edema.  ?   Comments: No tenderness on palpation of spine.  There is spine curvature noted on exam  ?Skin: ?   General: Skin is warm.  ?Neurological:  ?   General: No focal deficit present.  ?   Mental Status: She is alert and oriented to person, place, and time.  ?Psychiatric:     ?   Mood and Affect: Mood normal.     ?   Behavior: Behavior normal.     ?   Thought Content: Thought content normal.     ?   Judgment: Judgment normal.  ? ? ?Assessment & Plan:  ? ?Problem List Items Addressed This Visit   ? ?  ? Musculoskeletal and  Integument  ? Scoliosis - Primary  ?  History of scoliosis  with ongoing back pain and spasms.  She also endorses a burning pain to her right lower back.  We will start gabapentin 300 mg at bedtime for 3 days then increase to 300 mg twice daily.  We will also refill tizanidine as needed for muscle spasms.  Referral placed to neurosurgery.  Follow-up in 4 weeks. ?  ?  ? Relevant Orders  ? Ambulatory referral to Neurosurgery  ?  ? Other  ? Anxiety  ?  Chronic, ongoing.  PHQ-9 is a 5 and GAD-7 is a 9.  We discussed nonpharmacological treatments for anxiety including exercise, meditation, journaling.  Follow-up in 4 weeks. ?  ?  ? Relevant Orders  ? CBC with Differential/Platelet (Completed)  ? Comprehensive metabolic panel (Completed)  ? TSH (Completed)  ? ?Other Visit Diagnoses   ? ? Screening, lipid      ? Check lipid panel today and treat based on results  ? Relevant Orders  ? Lipid panel (Completed)  ? Need for Td vaccine      ? TD booster updated today  ? Relevant Orders  ? Td vaccine greater than or equal to 7yo preservative free IM (Completed)  ? Need for shingles vaccine      ? Shingrix vaccine #1 given today.  ? Relevant Orders  ? Varicella-zoster vaccine IM (Completed)  ? ?  ? ? ?Outpatient Encounter Medications as of 03/05/2022  ?Medication Sig  ? gabapentin (NEURONTIN) 300 MG capsule Take 1 capsule (300 mg total) by mouth 2 (two) times daily. Take 1 capsule at bedtime for 3 days, then you can increase to twice a day. May make you sleepy  ? albuterol (PROVENTIL HFA;VENTOLIN HFA) 108 (90 Base) MCG/ACT inhaler Inhale 2 puffs into the lungs every 6 (six) hours as needed for wheezing or shortness of breath.  ? ibuprofen (ADVIL,MOTRIN) 200 MG tablet Take 400-800 mg by mouth every 6 (six) hours as needed for headache or moderate pain.  ? tiZANidine (ZANAFLEX) 4 MG tablet Take 1 tablet (4 mg total) by mouth every 8 (eight) hours as needed.  ? [DISCONTINUED] amoxicillin-clavulanate (AUGMENTIN) 875-125 MG tablet Take 1  tablet by mouth every 12 (twelve) hours.  ? [DISCONTINUED] ibuprofen (ADVIL,MOTRIN) 800 MG tablet Take 1 tablet (800 mg total) by mouth every 8 (eight) hours as needed. (Patient not taking: Reported on 5/5/

## 2022-03-05 ENCOUNTER — Encounter: Payer: Self-pay | Admitting: Nurse Practitioner

## 2022-03-05 ENCOUNTER — Ambulatory Visit (INDEPENDENT_AMBULATORY_CARE_PROVIDER_SITE_OTHER): Payer: 59 | Admitting: Nurse Practitioner

## 2022-03-05 VITALS — BP 90/60 | HR 76 | Temp 97.6°F | Ht 62.0 in | Wt 138.0 lb

## 2022-03-05 DIAGNOSIS — Z13220 Encounter for screening for lipoid disorders: Secondary | ICD-10-CM

## 2022-03-05 DIAGNOSIS — F419 Anxiety disorder, unspecified: Secondary | ICD-10-CM | POA: Diagnosis not present

## 2022-03-05 DIAGNOSIS — Z23 Encounter for immunization: Secondary | ICD-10-CM | POA: Diagnosis not present

## 2022-03-05 DIAGNOSIS — M419 Scoliosis, unspecified: Secondary | ICD-10-CM

## 2022-03-05 LAB — CBC WITH DIFFERENTIAL/PLATELET
Basophils Absolute: 0 10*3/uL (ref 0.0–0.1)
Basophils Relative: 0.4 % (ref 0.0–3.0)
Eosinophils Absolute: 0.1 10*3/uL (ref 0.0–0.7)
Eosinophils Relative: 1.9 % (ref 0.0–5.0)
HCT: 46.1 % — ABNORMAL HIGH (ref 36.0–46.0)
Hemoglobin: 15.3 g/dL — ABNORMAL HIGH (ref 12.0–15.0)
Lymphocytes Relative: 25.5 % (ref 12.0–46.0)
Lymphs Abs: 1.9 10*3/uL (ref 0.7–4.0)
MCHC: 33.2 g/dL (ref 30.0–36.0)
MCV: 94.4 fl (ref 78.0–100.0)
Monocytes Absolute: 0.5 10*3/uL (ref 0.1–1.0)
Monocytes Relative: 6.3 % (ref 3.0–12.0)
Neutro Abs: 4.9 10*3/uL (ref 1.4–7.7)
Neutrophils Relative %: 65.9 % (ref 43.0–77.0)
Platelets: 250 10*3/uL (ref 150.0–400.0)
RBC: 4.88 Mil/uL (ref 3.87–5.11)
RDW: 13.1 % (ref 11.5–15.5)
WBC: 7.4 10*3/uL (ref 4.0–10.5)

## 2022-03-05 LAB — LIPID PANEL
Cholesterol: 179 mg/dL (ref 0–200)
HDL: 54.6 mg/dL (ref >39.00)
LDL Cholesterol: 109 mg/dL — ABNORMAL HIGH (ref 0–99)
NonHDL: 124.57
Total CHOL/HDL Ratio: 3
Triglycerides: 76 mg/dL (ref 0.0–149.0)
VLDL: 15.2 mg/dL (ref 0.0–40.0)

## 2022-03-05 LAB — COMPREHENSIVE METABOLIC PANEL
ALT: 11 U/L (ref 0–35)
AST: 15 U/L (ref 0–37)
Albumin: 3.9 g/dL (ref 3.5–5.2)
Alkaline Phosphatase: 42 U/L (ref 39–117)
BUN: 9 mg/dL (ref 6–23)
CO2: 29 mEq/L (ref 19–32)
Calcium: 9.3 mg/dL (ref 8.4–10.5)
Chloride: 105 mEq/L (ref 96–112)
Creatinine, Ser: 0.57 mg/dL (ref 0.40–1.20)
GFR: 104.83 mL/min (ref >60.00)
Glucose, Bld: 96 mg/dL (ref 70–99)
Potassium: 4.6 mEq/L (ref 3.5–5.1)
Sodium: 140 mEq/L (ref 135–145)
Total Bilirubin: 0.2 mg/dL (ref 0.2–1.2)
Total Protein: 6.2 g/dL (ref 6.0–8.3)

## 2022-03-05 LAB — TSH: TSH: 1.24 u[IU]/mL (ref 0.35–5.50)

## 2022-03-05 MED ORDER — TIZANIDINE HCL 4 MG PO TABS: 4.0000 mg | ORAL_TABLET | Freq: Three times a day (TID) | ORAL | 0 refills | Status: AC | PRN

## 2022-03-05 MED ORDER — GABAPENTIN 300 MG PO CAPS: 300.0000 mg | ORAL_CAPSULE | Freq: Two times a day (BID) | ORAL | 1 refills | Status: DC

## 2022-03-05 NOTE — Patient Instructions (Signed)
It was great to see you! ? ?Let's check some labs today and we will call with the results. ? ?I have sent in gabapentin to take a bedtime for 3 days, then take twice a day, this may make you sleepy. ? ?I have refilled your muscle relaxer and placed referral to spinal surgery.  ? ?Let's follow-up in 4 weeks, sooner if you have concerns. ? ?If a referral was placed today, you will be contacted for an appointment. Please note that routine referrals can sometimes take up to 3-4 weeks to process. Please call our office if you haven't heard anything after this time frame. ? ?Take care, ? ?Vance Peper, NP ? ?

## 2022-03-06 DIAGNOSIS — M419 Scoliosis, unspecified: Secondary | ICD-10-CM | POA: Insufficient documentation

## 2022-03-06 DIAGNOSIS — F419 Anxiety disorder, unspecified: Secondary | ICD-10-CM | POA: Insufficient documentation

## 2022-03-06 NOTE — Assessment & Plan Note (Signed)
Chronic, ongoing.  PHQ-9 is a 5 and GAD-7 is a 9.  We discussed nonpharmacological treatments for anxiety including exercise, meditation, journaling.  Follow-up in 4 weeks. ?

## 2022-03-06 NOTE — Assessment & Plan Note (Signed)
History of scoliosis with ongoing back pain and spasms.  She also endorses a burning pain to her right lower back.  We will start gabapentin 300 mg at bedtime for 3 days then increase to 300 mg twice daily.  We will also refill tizanidine as needed for muscle spasms.  Referral placed to neurosurgery.  Follow-up in 4 weeks. ?

## 2022-03-10 ENCOUNTER — Telehealth: Payer: 59 | Admitting: Nurse Practitioner

## 2022-03-10 DIAGNOSIS — M419 Scoliosis, unspecified: Secondary | ICD-10-CM

## 2022-03-10 NOTE — Progress Notes (Signed)
Called and informed patient of results and provider instructions. Patient voiced understanding.

## 2022-03-10 NOTE — Telephone Encounter (Signed)
Pt has been referred to [facility], Kristine Mccarthy a Neurosurgeon. His office called and they are not contracted with her insurance plan. Please send pt to another neurosurgeon.  ? ? ?

## 2022-03-23 NOTE — Progress Notes (Signed)
? ?BP 123/83   Pulse 80   Temp 97.8 ?F (36.6 ?C) (Temporal)   Ht '5\' 2"'$  (1.575 m)   Wt 136 lb 3.2 oz (61.8 kg)   LMP 07/25/2020   SpO2 94%   BMI 24.91 kg/m?   ? ?Subjective:  ? ? Patient ID: Kristine Mccarthy, female    DOB: 05/21/1971, 51 y.o.   MRN: [id number] ? ?CC: ?Chief Complaint  ?Patient presents with  ? Annual Exam  ?  CPE. Pt c/o acid reflux issues.  ? ?HPI: ?Kristine Mccarthy is a 51 y.o. female presenting on 03/26/2022 for comprehensive medical examination. Current medical complaints include: ongoing back pain and acid reflux ? ?She has been having ongoing back pain which is burning since her last visit. She has a history of scoliosis. She started gabaepentin once a day and has not noticed much improvement. Today she rates it as a 9/10. She has pain every day and some days are worse than others.  The pain does not radiate.  She states that there are no neuro surgeons that are in her network.  She has an appointment with orthopedics on 04/07/2022. ? ?She started having acid reflux for the past few months. The symtpoms are worse at night. She tried tums and rolaids which have not helped. She sometimes eats before laying down at night.  She is wondering if there is anything else that can help with her reflux.  She denies constipation, diarrhea. ? ?She currently lives with: Roommate ?Menopausal Symptoms: yes - she has not had period since last July 2022, hot flashes sometimes  ? ?Depression Screen done today and results listed below:  ? ?  03/05/2022  ? 12:09 PM  ?Depression screen PHQ 2/9  ?Decreased Interest 0  ?Down, Depressed, Hopeless 1  ?PHQ - 2 Score 1  ?Altered sleeping 3  ?Tired, decreased energy 1  ?Change in appetite 0  ?Feeling bad or failure about yourself  0  ?Trouble concentrating 0  ?Moving slowly or fidgety/restless 0  ?Suicidal thoughts 0  ?PHQ-9 Score 5  ?Difficult doing work/chores Somewhat difficult  ? ? ?The patient does not have a history of falls. I did not complete a risk assessment  for falls. A plan of care for falls was not documented. ? ? ?Past Medical History:  ?Past Medical History:  ?Diagnosis Date  ? Abnormal Pap smear 1992  ? Leep, biopsy  ? Anxiety   ? Arthritis   ? Breast mass in female   ? Left  ? Bronchitis   ? Cervical dysplasia   ? level 3  ? PID (acute pelvic inflammatory disease)   ? Scoliosis   ? Substance abuse ([place])   ? Urinary tract infection   ? ? ?Surgical History:  ?Past Surgical History:  ?Procedure Laterality Date  ? BREAST EXCISIONAL BIOPSY Left 11/26/2017  ? sclerosing lesion  ? BREAST LUMPECTOMY WITH RADIOACTIVE SEED LOCALIZATION Left 11/26/2017  ? Procedure: LEFT BREAST LUMPECTOMY WITH RADIOACTIVE SEED LOCALIZATION ERAS PATHWAY;  Surgeon: Erroll Luna, MD;  Location: [facility];  Service: General;  Laterality: Left;  ? BREAST SURGERY    ? CERVICAL BIOPSY  W/ LOOP ELECTRODE EXCISION    ? oever 26 years ago  ? COLPOSCOPY    ? over 26 years ago  ? DILATION AND CURETTAGE OF UTERUS    ? INDUCED ABORTION    ? LEEP    ? ? ?Medications:  ?Current Outpatient Medications on File Prior to Visit  ?Medication Sig  ?  albuterol (PROVENTIL HFA;VENTOLIN HFA) 108 (90 Base) MCG/ACT inhaler Inhale 2 puffs into the lungs every 6 (six) hours as needed for wheezing or shortness of breath.  ? gabapentin (NEURONTIN) 300 MG capsule Take 1 capsule (300 mg total) by mouth 2 (two) times daily. Take 1 capsule at bedtime for 3 days, then you can increase to twice a day. May make you sleepy  ? ibuprofen (ADVIL,MOTRIN) 200 MG tablet Take 400-800 mg by mouth every 6 (six) hours as needed for headache or moderate pain.  ? tiZANidine (ZANAFLEX) 4 MG tablet Take 1 tablet (4 mg total) by mouth every 8 (eight) hours as needed.  ? ?No current facility-administered medications on file prior to visit.  ? ? ?Allergies:  ?Allergies  ?Allergen Reactions  ? Morphine And Related Nausea And Vomiting  ? ? ?Social History:  ?Social History  ? ?Socioeconomic History  ? Marital status: Divorced  ?  Spouse name: Not on  file  ? Number of children: 2  ? Years of education: Not on file  ? Highest education level: Some college, no degree  ?Occupational History  ? Not on file  ?Tobacco Use  ? Smoking status: Every Day  ?  Packs/day: 1.00  ?  Years: 26.00  ?  Pack years: 26.00  ?  Types: Cigarettes  ? Smokeless tobacco: Never  ? Tobacco comments:  ?  pt states will quit b/c she is pregnant  ?Vaping Use  ? Vaping Use: Never used  ?Substance and Sexual Activity  ? Alcohol use: Yes  ?  Comment: once or twice a month beer  ? Drug use: Yes  ?  Types: Cocaine, Marijuana  ?  Comment: last used 5 to 6 years ago  ? Sexual activity: Yes  ?  Birth control/protection: None, Condom  ?Other Topics Concern  ? Not on file  ?Social History Narrative  ? Not on file  ? ?Social Determinants of Health  ? ?Financial Resource Strain: Not on file  ?Food Insecurity: Not on file  ?Transportation Needs: Not on file  ?Physical Activity: Not on file  ?Stress: Not on file  ?Social Connections: Not on file  ?Intimate Partner Violence: Not on file  ? ?Social History  ? ?Tobacco Use  ?Smoking Status Every Day  ? Packs/day: 1.00  ? Years: 26.00  ? Pack years: 26.00  ? Types: Cigarettes  ?Smokeless Tobacco Never  ?Tobacco Comments  ? pt states will quit b/c she is pregnant  ? ?Social History  ? ?Substance and Sexual Activity  ?Alcohol Use Yes  ? Comment: once or twice a month beer  ? ? ?Family History:  ?Family History  ?Problem Relation Age of Onset  ? Cancer Maternal Grandmother   ?     ovarian  ? Heart disease Maternal Grandmother   ? Heart disease Father   ? Breast cancer Paternal Aunt   ? ? ?Past medical history, surgical history, medications, allergies, family history and social history reviewed with patient today and changes made to appropriate areas of the chart.  ? ?Review of Systems  ?Constitutional: Negative.   ?HENT: Negative.    ?Eyes:  Positive for blurred vision.  ?Respiratory: Negative.    ?Cardiovascular: Negative.   ?Gastrointestinal:  Positive for  heartburn. Negative for constipation and diarrhea.  ?Genitourinary: Negative.   ?Musculoskeletal:  Positive for back pain.  ?Skin: Negative.   ?Neurological: Negative.   ?Psychiatric/Behavioral: Negative.    ?All other ROS negative except what is listed above and in the  HPI.  ? ?   ?Objective:  ?  ?BP 123/83   Pulse 80   Temp 97.8 ?F (36.6 ?C) (Temporal)   Ht '5\' 2"'$  (1.575 m)   Wt 136 lb 3.2 oz (61.8 kg)   LMP 07/25/2020   SpO2 94%   BMI 24.91 kg/m?   ?Wt Readings from Last 3 Encounters:  ?03/26/22 136 lb 3.2 oz (61.8 kg)  ?03/05/22 138 lb (62.6 kg)  ?04/25/19 156 lb (70.8 kg)  ?  ?Physical Exam ?Vitals and nursing note reviewed. Exam conducted with a chaperone present.  ?Constitutional:   ?   General: She is not in acute distress. ?   Appearance: Normal appearance.  ?HENT:  ?   Head: Normocephalic and atraumatic.  ?   Right Ear: Tympanic membrane, ear canal and external ear normal.  ?   Left Ear: Tympanic membrane, ear canal and external ear normal.  ?   Nose: Nose normal.  ?   Mouth/Throat:  ?   Mouth: Mucous membranes are moist.  ?   Pharynx: Oropharynx is clear.  ?Eyes:  ?   Conjunctiva/sclera: Conjunctivae normal.  ?Cardiovascular:  ?   Rate and Rhythm: Normal rate and regular rhythm.  ?   Pulses: Normal pulses.  ?   Heart sounds: Normal heart sounds.  ?Pulmonary:  ?   Effort: Pulmonary effort is normal.  ?   Breath sounds: Normal breath sounds.  ?Chest:  ?Breasts: ?   Right: Normal.  ?   Left: Normal.  ?Abdominal:  ?   General: Bowel sounds are normal.  ?   Palpations: Abdomen is soft.  ?   Tenderness: There is no abdominal tenderness.  ?Genitourinary: ?   General: Normal vulva.  ?   Labia:     ?   Right: No rash, tenderness or lesion.     ?   Left: No rash, tenderness or lesion.   ?   Vagina: Normal.  ?   Cervix: Normal.  ?   Uterus: Absent.   ?   Adnexa: Right adnexa normal and left adnexa normal.  ?Musculoskeletal:     ?   General: Tenderness present.  ?   Cervical back: Normal range of motion. No  tenderness.  ?   Right lower leg: No edema.  ?   Left lower leg: No edema.  ?   Comments: Tenderness on palpation of spine.  Scoliosis is noted  ?Lymphadenopathy:  ?   Cervical: No cervical adenopathy.  ?   Upper Body:  ?

## 2022-03-26 ENCOUNTER — Ambulatory Visit (INDEPENDENT_AMBULATORY_CARE_PROVIDER_SITE_OTHER): Payer: 59 | Admitting: Nurse Practitioner

## 2022-03-26 ENCOUNTER — Encounter: Payer: Self-pay | Admitting: Nurse Practitioner

## 2022-03-26 ENCOUNTER — Other Ambulatory Visit (HOSPITAL_COMMUNITY)
Admission: RE | Admit: 2022-03-26 | Discharge: 2022-03-26 | Disposition: A | Discharge status: Home / Self Care | Payer: 59 | Source: Ambulatory Visit | Attending: Nurse Practitioner | Admitting: Nurse Practitioner

## 2022-03-26 VITALS — BP 123/83 | HR 80 | Temp 97.8°F | Ht 62.0 in | Wt 136.2 lb

## 2022-03-26 DIAGNOSIS — Z124 Encounter for screening for malignant neoplasm of cervix: Secondary | ICD-10-CM

## 2022-03-26 DIAGNOSIS — Z0000 Encounter for general adult medical examination without abnormal findings: Secondary | ICD-10-CM

## 2022-03-26 DIAGNOSIS — Z1211 Encounter for screening for malignant neoplasm of colon: Secondary | ICD-10-CM

## 2022-03-26 DIAGNOSIS — Z1231 Encounter for screening mammogram for malignant neoplasm of breast: Secondary | ICD-10-CM | POA: Diagnosis not present

## 2022-03-26 DIAGNOSIS — D229 Melanocytic nevi, unspecified: Secondary | ICD-10-CM

## 2022-03-26 DIAGNOSIS — M419 Scoliosis, unspecified: Secondary | ICD-10-CM

## 2022-03-26 MED ORDER — DULOXETINE HCL 30 MG PO CPEP: 30.0000 mg | ORAL_CAPSULE | Freq: Every day | ORAL | 2 refills | Status: AC

## 2022-03-26 MED ORDER — OMEPRAZOLE 40 MG PO CPDR: 40.0000 mg | DELAYED_RELEASE_CAPSULE | Freq: Every day | ORAL | 1 refills | Status: AC

## 2022-03-26 NOTE — Assessment & Plan Note (Signed)
Chronic, still having ongoing pain.  She started gabapentin 300 mg at bedtime last visit.  Will increase this to 300 mg twice a day.  We will also start duloxetine 30 mg daily to see if this will help with her pain.  Keep her appointment with orthopedics on 04/07/2022.  Follow-up in 6 to 8 weeks after orthopedic appointment or sooner with concerns. ?

## 2022-03-26 NOTE — Assessment & Plan Note (Signed)
Noted to left breast.  It is oval, symmetrical, however has some varying colors.  With the location we will refer her to dermatology. ?

## 2022-03-26 NOTE — Patient Instructions (Signed)
It was great to see you! ? ?I have ordered the cologuard kit for colon cancer screening. ? ?I have placed an order for screening mammogram, they should call you to schedule. If you do not hear from them in the next week, please call: ? ?[facility] ?[address] ?[place], [place] ?[phone] ? ?Start omeprazole 1 tablet daily for acid reflux. ? ?Increase gabapentin to twice a day for your back pain. Also start duloxetine 1 capsule daily for back pain. Keep your appointment with orthopedics.  ? ?Let's follow-up in 3 months, sooner if you have concerns. ? ?If a referral was placed today, you will be contacted for an appointment. Please note that routine referrals can sometimes take up to 3-4 weeks to process. Please call our office if you haven't heard anything after this time frame. ? ?Take care, ? ?Vance Peper, NP ? ?

## 2022-03-31 LAB — CYTOLOGY - PAP
Comment: NEGATIVE
Diagnosis: UNDETERMINED — AB
High risk HPV: NEGATIVE

## 2022-04-01 NOTE — Progress Notes (Signed)
Called and informed patient of results and provider instructions. Patient voiced understanding. Sw, cma

## 2022-04-07 ENCOUNTER — Ambulatory Visit: Payer: 59 | Admitting: Orthopaedic Surgery

## 2022-04-07 ENCOUNTER — Ambulatory Visit (INDEPENDENT_AMBULATORY_CARE_PROVIDER_SITE_OTHER): Payer: 59

## 2022-04-07 DIAGNOSIS — M419 Scoliosis, unspecified: Secondary | ICD-10-CM | POA: Diagnosis not present

## 2022-04-07 NOTE — Progress Notes (Signed)
? ?Office Visit Note ?  ?Patient: Kristine Mccarthy           ?Date of Birth: 05-02-1971           ?MRN: [id number] ?Visit Date: 04/07/2022 ?             ?Requested by: Charyl Dancer, NP ?[place],  [place] [address] ?PCP: Charyl Dancer, NP ? ? ?Assessment & Plan: ?Visit Diagnoses:  ?1. Scoliosis, unspecified scoliosis type, unspecified spinal region   ? ? ?Plan: Kristine Mccarthy comes in today for evaluation of chronic neck and back pain due to scoliosis since adolescence.  This is a second opinion.  She has seen Dr. Arnoldo Morale for this as well.  She is hoping to get disability.  After our conversation we agreed to have her follow-up with Dr. Lorin Mercy or Louanne Skye for her condition. ? ?Follow-Up Instructions: No follow-ups on file.  ? ?Orders:  ?Orders Placed This Encounter  ?Procedures  ? XR SCOLIOSIS EVAL COMPLETE SPINE 2 OR 3 VIEWS  ? ?No orders of the defined types were placed in this encounter. ? ? ? ? Procedures: ?No procedures performed ? ? ?Clinical Data: ?No additional findings. ? ? ?Subjective: ?Chief Complaint  ?Patient presents with  ? Lower Back - Follow-up  ? ? ?HPI ? ?Review of Systems ? ? ?Objective: ?Vital Signs: LMP 07/25/2020  ? ?Physical Exam ? ?Ortho Exam ? ?Specialty Comments:  ?No specialty comments available. ? ?Imaging: ?XR SCOLIOSIS EVAL COMPLETE SPINE 2 OR 3 VIEWS ? ?Result Date: 04/07/2022 ?Thoracolumbar scoliosis.  ? ? ?PMFS History: ?Patient Active Problem List  ? Diagnosis Date Noted  ? Nevus 03/26/2022  ? Scoliosis 03/06/2022  ? Anxiety 03/06/2022  ? Ectopic pregnancy 10/10/2011  ? ?Past Medical History:  ?Diagnosis Date  ? Abnormal Pap smear 1992  ? Leep, biopsy  ? Anxiety   ? Arthritis   ? Breast mass in female   ? Left  ? Bronchitis   ? Cervical dysplasia   ? level 3  ? PID (acute pelvic inflammatory disease)   ? Scoliosis   ? Substance abuse ([place])   ? Urinary tract infection   ?  ?Family History  ?Problem Relation Age of Onset  ? Cancer Maternal Grandmother   ?     ovarian   ? Heart disease Maternal Grandmother   ? Heart disease Father   ? Breast cancer Paternal Aunt   ?  ?Past Surgical History:  ?Procedure Laterality Date  ? BREAST EXCISIONAL BIOPSY Left 11/26/2017  ? sclerosing lesion  ? BREAST LUMPECTOMY WITH RADIOACTIVE SEED LOCALIZATION Left 11/26/2017  ? Procedure: LEFT BREAST LUMPECTOMY WITH RADIOACTIVE SEED LOCALIZATION ERAS PATHWAY;  Surgeon: Erroll Luna, MD;  Location: [facility];  Service: General;  Laterality: Left;  ? BREAST SURGERY    ? CERVICAL BIOPSY  W/ LOOP ELECTRODE EXCISION    ? oever 26 years ago  ? COLPOSCOPY    ? over 26 years ago  ? DILATION AND CURETTAGE OF UTERUS    ? INDUCED ABORTION    ? LEEP    ? ?Social History  ? ?Occupational History  ? Not on file  ?Tobacco Use  ? Smoking status: Every Day  ?  Packs/day: 1.00  ?  Years: 26.00  ?  Pack years: 26.00  ?  Types: Cigarettes  ? Smokeless tobacco: Never  ? Tobacco comments:  ?  pt states will quit b/c she is pregnant  ?Vaping Use  ? Vaping Use: Never  used  ?Substance and Sexual Activity  ? Alcohol use: Yes  ?  Comment: once or twice a month beer  ? Drug use: Yes  ?  Types: Cocaine, Marijuana  ?  Comment: last used 5 to 6 years ago  ? Sexual activity: Yes  ?  Birth control/protection: None, Condom  ? ? ? ? ? ? ?

## 2022-04-09 ENCOUNTER — Other Ambulatory Visit: Payer: Self-pay

## 2022-04-09 DIAGNOSIS — M419 Scoliosis, unspecified: Secondary | ICD-10-CM

## 2022-05-26 ENCOUNTER — Other Ambulatory Visit: Payer: Self-pay

## 2022-05-26 MED ORDER — GABAPENTIN 300 MG PO CAPS: 300.0000 mg | ORAL_CAPSULE | Freq: Two times a day (BID) | ORAL | 1 refills | Status: AC

## 2022-05-27 ENCOUNTER — Ambulatory Visit: Payer: 59 | Admitting: Orthopaedic Surgery

## 2022-05-27 VITALS — BP 114/73 | HR 73

## 2022-05-27 DIAGNOSIS — M419 Scoliosis, unspecified: Secondary | ICD-10-CM | POA: Diagnosis not present

## 2022-05-27 NOTE — Progress Notes (Signed)
Office Visit Note   Patient: Kristine Mccarthy           Date of Birth: [birth year]           MRN: [id number] Visit Date: 05/27/2022              Requested by: Leandrew Koyanagi, MD [address] [place],  [place] [address] PCP: Charyl Dancer, NP   Assessment & Plan: Visit Diagnoses:  1. 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  Activities where she would do some sitting some standing for walking would be less likely to bother her symptoms.  We discussed correction lumbar curvature would require referral to tertiary referral center like a university due to the complexity of her curve.  Follow-Up Instructions: No follow-ups on file.   Orders:  No orders of the defined types were placed in this encounter.  No orders of the defined types were placed in this encounter.     Procedures: No procedures performed   Clinical Data: No additional findings.   Subjective: Chief Complaint  Patient presents with   Lower Back - Pain    HPI 51 year old female with scoliosis was seen by Dr. Arnoldo Morale at [facility] with diagnosis of scoliosis.  She was given a brace as child but did not wear it since other kids made fun of her.  She states she applied for disability but attorney told her she did not have enough information.  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  She has a thoracolumbar curve upper thoracic curve.  No associated bowel or bladder symptoms.  Patient has history of anxiety.  She had pain primarily mid right lumbar paraspinal that radiates down to the SI joint region on the right.   No  Cauda Equina symptoms.  Review of Systems all the systems noncontributory to HPI.   Objective: Vital Signs: BP 114/73   Pulse 73   LMP 07/25/2020   Physical Exam Constitutional:      Appearance: She is  well-developed.  HENT:     Head: Normocephalic.     Right Ear: External ear normal.     Left Ear: External ear normal. There is no impacted cerumen.  Eyes:     Pupils: Pupils are equal, round, and reactive to light.  Neck:     Thyroid: No thyromegaly.     Trachea: No tracheal deviation.  Cardiovascular:     Rate and Rhythm: Normal rate.  Pulmonary:     Effort: Pulmonary effort is normal.  Abdominal:     Palpations: Abdomen is soft.  Musculoskeletal:     Cervical back: No rigidity.  Skin:    General: Skin is warm and dry.  Neurological:     Mental Status: She is alert and oriented to person, place, and time.  Psychiatric:        Behavior: Behavior normal.    Ortho Exam right lumbar long thoracolumbar left curvature.  Curvature upper thoracic and lower cervical noted.  Lower extremity reflexes are intact she is able to heel and toe walk.  Pelvis is level.  Specialty Comments:  No specialty comments available.  Imaging: No results found.   PMFS History: Patient Active Problem List   Diagnosis Date Noted   Nevus 03/26/2022   Scoliosis 03/06/2022   Anxiety 03/06/2022   Ectopic pregnancy  10/10/2011   Past Medical History:  Diagnosis Date   Abnormal Pap smear 1992   Leep, biopsy   Anxiety    Arthritis    Breast mass in female    Left   Bronchitis    Cervical dysplasia    level 3   PID (acute pelvic inflammatory disease)    Scoliosis    Substance abuse ([place])    Urinary tract infection     Family History  Problem Relation Age of Onset   Cancer Maternal Grandmother        ovarian   Heart disease Maternal Grandmother    Heart disease Father    Breast cancer Paternal Aunt     Past Surgical History:  Procedure Laterality Date   BREAST EXCISIONAL BIOPSY Left 11/26/2017   sclerosing lesion   BREAST LUMPECTOMY WITH RADIOACTIVE SEED LOCALIZATION Left 11/26/2017   Procedure: LEFT BREAST LUMPECTOMY WITH RADIOACTIVE SEED LOCALIZATION ERAS PATHWAY;  Surgeon: Erroll Luna, MD;  Location: [facility];  Service: General;  Laterality: Left;   BREAST SURGERY     CERVICAL BIOPSY  W/ LOOP ELECTRODE EXCISION     oever 26 years ago   COLPOSCOPY     over 26 years ago   DILATION AND CURETTAGE OF UTERUS     INDUCED ABORTION     LEEP     Social History   Occupational History   Not on file  Tobacco Use   Smoking status: Every Day    Packs/day: 1.00    Years: 26.00    Pack years: 26.00    Types: Cigarettes   Smokeless tobacco: Never   Tobacco comments:    pt states will quit b/c she is pregnant  Vaping Use   Vaping Use: Never used  Substance and Sexual Activity   Alcohol use: Yes    Comment: once or twice a month beer   Drug use: Yes    Types: Cocaine, Marijuana    Comment: last used 5 to 6 years ago   Sexual activity: Yes    Birth control/protection: None, Condom

## 2022-06-15 ENCOUNTER — Ambulatory Visit (INDEPENDENT_AMBULATORY_CARE_PROVIDER_SITE_OTHER): Payer: 59 | Admitting: Family Medicine

## 2022-06-15 ENCOUNTER — Telehealth: Payer: Self-pay | Admitting: Nurse Practitioner

## 2022-06-15 ENCOUNTER — Encounter: Payer: Self-pay | Admitting: Family Medicine

## 2022-06-15 VITALS — BP 100/74 | HR 70 | Temp 97.1°F | Wt 134.4 lb

## 2022-06-15 DIAGNOSIS — Z1231 Encounter for screening mammogram for malignant neoplasm of breast: Secondary | ICD-10-CM

## 2022-06-15 DIAGNOSIS — R21 Rash and other nonspecific skin eruption: Secondary | ICD-10-CM | POA: Diagnosis not present

## 2022-06-15 MED ORDER — TRIAMCINOLONE ACETONIDE 0.5 % EX OINT: 1.0000 | TOPICAL_OINTMENT | Freq: Two times a day (BID) | CUTANEOUS | 3 refills | Status: AC

## 2022-06-15 MED ORDER — CETIRIZINE HCL 10 MG PO TABS: 10.0000 mg | ORAL_TABLET | Freq: Every day | ORAL | 11 refills | Status: AC | PRN

## 2022-06-15 NOTE — Progress Notes (Signed)
   Kristine Mccarthy is a 51 y.o. female who presents today for an office visit.  Assessment/Plan:  New/Acute Problems: Rash May have started off as a contact dermatitis per history Patient is aggressive and scratching and treating with alcohol and Clorox daily trying to relieve itch, however I think this is caused more of a persisting of the original rash Recommend discontinuing home treatments with alcohol and Clorox, discussed possible worsening of rash Trial antihistamines and topical steroids as ordered Follow-up as needed  Chronic Problems Addressed Today: No problem-specific Assessment & Plan notes found for this encounter.     Subjective:  HPI:  Patient presents with rash on bilateral forearms.  Is extremely pruritic.  Patient developed this approximately 3 months ago.  She was doing extensive amount of yard work at this time and got exposed to which he believes with poison ivy.  She says she has been sensitive to this all her life.  Patient has been very aggressively treating it at home with Clorox and alcohol including wraps daily for this and soaking her arms and this.  Patient reports that this is very uncomfortable.  It still remains very pruritic and patient has been scratching incessantly.       Objective:  Physical Exam: BP 100/74 (BP Location: Left Arm, Patient Position: Sitting, Cuff Size: Large)   Pulse 70   Temp (!) 97.1 F (36.2 C) (Temporal)   Wt 134 lb 6.4 oz (61 kg)   LMP 07/25/2020   SpO2 97%   BMI 24.58 kg/m   Gen: No acute distress, resting comfortably Skin: Maculopapular rash distributed along dorsal and ventral forearms, extensive corrugations, no signs of active infection or drainage     Garner Nash, MD, MS

## 2022-06-22 ENCOUNTER — Ambulatory Visit: Payer: 59 | Admitting: Nurse Practitioner

## 2022-06-22 NOTE — Progress Notes (Deleted)
   Established Patient Office Visit  Subjective   Patient ID: Kristine Mccarthy, female    DOB: 07/06/1971  Age: 51 y.o. MRN: [id number]  No chief complaint on file.   HPI  Kristine Mccarthy is here today to follow-up on chronic back pain from scoliosis. Last visit her gabapentin was increased to '300mg'$  BID and she was started on duloxetine '30mg'$  daily.   {History (Optional):23778}  ROS    Objective:     LMP 07/25/2020  {Vitals History (Optional):23777}  Physical Exam   No results found for any visits on 06/25/22.  {Labs (Optional):23779}  The 10-year ASCVD risk score (Arnett DK, et al., 2019) is: 2.2%    Assessment & Plan:   Problem List Items Addressed This Visit   None   No follow-ups on file.    Charyl Dancer, NP

## 2022-06-22 NOTE — Progress Notes (Deleted)
   Established Patient Office Visit  Subjective   Patient ID: Kristine Mccarthy, female    DOB: 17-Nov-1971  Age: 51 y.o. MRN: [id number]  No chief complaint on file.   HPI  Kristine Mccarthy is here to follow-up on rash. She was seen on 06/15/22 and was prescribed zyrtec and triamcinolone cream.  {H/istory (Optional):23778}  ROS    Objective:     LMP 07/25/2020  {Vitals History (Optional):23777}  Physical Exam   No results found for any visits on 06/22/22.  {Labs (Optional):23779}  The 10-year ASCVD risk score (Arnett DK, et al., 2019) is: 2.2%    Assessment & Plan:   Problem List Items Addressed This Visit   None   No follow-ups on file.    Charyl Dancer, NP

## 2022-06-25 ENCOUNTER — Ambulatory Visit: Payer: 59 | Admitting: Nurse Practitioner

## 2022-06-30 ENCOUNTER — Ambulatory Visit
Admission: EM | Admit: 2022-06-30 | Discharge: 2022-06-30 | Disposition: A | Discharge status: Home / Self Care | Payer: 59 | Attending: Internal Medicine | Admitting: Internal Medicine

## 2022-06-30 ENCOUNTER — Encounter: Payer: Self-pay | Admitting: Emergency Medicine

## 2022-06-30 ENCOUNTER — Other Ambulatory Visit: Payer: Self-pay

## 2022-06-30 DIAGNOSIS — L509 Urticaria, unspecified: Secondary | ICD-10-CM

## 2022-06-30 DIAGNOSIS — R21 Rash and other nonspecific skin eruption: Secondary | ICD-10-CM

## 2022-06-30 MED ORDER — PREDNISONE 20 MG PO TABS: 40.0000 mg | ORAL_TABLET | Freq: Every day | ORAL | 0 refills | Status: AC

## 2022-06-30 NOTE — ED Triage Notes (Signed)
Pt here for rash to arms x 2 months still with itching; pt sts tried cream without effect2 weeks ago

## 2022-06-30 NOTE — ED Provider Notes (Signed)
[name]    CSN: [id number] Arrival date & time: 06/30/22  1352      History   Chief Complaint Chief Complaint  Patient presents with   Rash    HPI Kristine Mccarthy is a 51 y.o. female.   Patient presents with rash that has been present for multiple months to bilateral arms. She states it is very itchy. Rash started after she was working outside and picked some brush up with her arms. Her friend who was working with her has similar rash on her arms. Patient was seen by PCP on 6/26 and prescribed antihistamine and triamcinolone cream with no improvement. Denies any associated fevers.    Rash   Past Medical History:  Diagnosis Date   Abnormal Pap smear 1992   Leep, biopsy   Anxiety    Arthritis    Breast mass in female    Left   Bronchitis    Cervical dysplasia    level 3   PID (acute pelvic inflammatory disease)    Scoliosis    Substance abuse ([place])    Urinary tract infection     Patient Active Problem List   Diagnosis Date Noted   Nevus 03/26/2022   Scoliosis 03/06/2022   Anxiety 03/06/2022   Ectopic pregnancy 10/10/2011    Past Surgical History:  Procedure Laterality Date   BREAST EXCISIONAL BIOPSY Left 11/26/2017   sclerosing lesion   BREAST LUMPECTOMY WITH RADIOACTIVE SEED LOCALIZATION Left 11/26/2017   Procedure: LEFT BREAST LUMPECTOMY WITH RADIOACTIVE SEED LOCALIZATION ERAS PATHWAY;  Surgeon: Erroll Luna, MD;  Location: [facility];  Service: General;  Laterality: Left;   BREAST SURGERY     CERVICAL BIOPSY  W/ LOOP ELECTRODE EXCISION     oever 26 years ago   COLPOSCOPY     over 26 years ago   DILATION AND CURETTAGE OF UTERUS     INDUCED ABORTION     LEEP      OB History     Gravida  5   Para  2   Term  2   Preterm  0   AB  3   Living  2      SAB  1   IAB  1   Ectopic  1   Multiple  0   Live Births  2            Home Medications    Prior to Admission medications   Medication Sig Start Date End Date  Taking? Authorizing Provider  predniSONE (DELTASONE) 20 MG tablet Take 2 tablets (40 mg total) by mouth daily for 5 days. 06/30/22 07/05/22 Yes , Michele Rockers, FNP  albuterol (PROVENTIL HFA;VENTOLIN HFA) 108 (90 Base) MCG/ACT inhaler Inhale 2 puffs into the lungs every 6 (six) hours as needed for wheezing or shortness of breath.    [provider]  cetirizine (ZYRTEC) 10 MG tablet Take 1 tablet (10 mg total) by mouth daily as needed for allergies. 06/15/22   Bonnita Hollow, MD  DULoxetine (CYMBALTA) 30 MG capsule Take 1 capsule (30 mg total) by mouth daily. 03/26/22   McElwee, Lauren A, NP  gabapentin (NEURONTIN) 300 MG capsule Take 1 capsule (300 mg total) by mouth 2 (two) times daily. Take 1 capsule at bedtime for 3 days, then you can increase to twice a day. May make you sleepy 05/26/22   McElwee, Lauren A, NP  ibuprofen (ADVIL,MOTRIN) 200 MG tablet Take 400-800 mg by mouth every 6 (six) hours as  needed for headache or moderate pain.    [provider]  omeprazole (PRILOSEC) 40 MG capsule Take 1 capsule (40 mg total) by mouth daily. 03/26/22   McElwee, Lauren A, NP  tiZANidine (ZANAFLEX) 4 MG tablet Take 1 tablet (4 mg total) by mouth every 8 (eight) hours as needed. 03/05/22   McElwee, Lauren A, NP  triamcinolone ointment (KENALOG) 0.5 % Apply 1 Application topically 2 (two) times daily. For moderate to severe eczema.  Do not use for more than 1 week at a time. 06/15/22   Bonnita Hollow, MD    Family History Family History  Problem Relation Age of Onset   Cancer Maternal Grandmother        ovarian   Heart disease Maternal Grandmother    Heart disease Father    Breast cancer Paternal Aunt     Social History Social History   Tobacco Use   Smoking status: Every Day    Packs/day: 1.00    Years: 26.00    Total pack years: 26.00    Types: Cigarettes   Smokeless tobacco: Never   Tobacco comments:    pt states will quit b/c she is pregnant  Vaping Use   Vaping Use: Never  used  Substance Use Topics   Alcohol use: Yes    Comment: once or twice a month beer   Drug use: Yes    Types: Cocaine, Marijuana    Comment: last used 5 to 6 years ago     Allergies   Morphine and related   Review of Systems Review of Systems Per HPI  Physical Exam Triage Vital Signs ED Triage Vitals  Enc Vitals Group     BP 06/30/22 1443 138/80     Pulse Rate 06/30/22 1443 (!) 102     Resp 06/30/22 1443 18     Temp 06/30/22 1443 98.3 F (36.8 C)     Temp Source 06/30/22 1443 Oral     SpO2 06/30/22 1443 94 %     Weight --      Height --      Head Circumference --      Peak Flow --      Pain Score 06/30/22 1444 0     Pain Loc --      Pain Edu? --      Excl. in [place]? --    No data found.  Updated Vital Signs BP 138/80 (BP Location: Left Arm)   Pulse (!) 102   Temp 98.3 F (36.8 C) (Oral)   Resp 18   LMP 07/25/2020   SpO2 94%   Visual Acuity Right Eye Distance:   Left Eye Distance:   Bilateral Distance:    Right Eye Near:   Left Eye Near:    Bilateral Near:     Physical Exam Constitutional:      General: She is not in acute distress.    Appearance: Normal appearance. She is not toxic-appearing or diaphoretic.  HENT:     Head: Normocephalic and atraumatic.  Eyes:     Extraocular Movements: Extraocular movements intact.     Conjunctiva/sclera: Conjunctivae normal.  Pulmonary:     Effort: Pulmonary effort is normal.  Skin:    Comments: Diffused maculopapular rash scattered throughout bilateral arms. No purulent drainage noted.   Neurological:     General: No focal deficit present.     Mental Status: She is alert and oriented to person, place, and time. Mental status is at baseline.  Psychiatric:        Mood and Affect: Mood normal.        Behavior: Behavior normal.        Thought Content: Thought content normal.        Judgment: Judgment normal.      UC Treatments / Results  Labs (all labs ordered are listed, but only abnormal results are  displayed) Labs Reviewed - No data to display  EKG   Radiology No results found.  Procedures Procedures (including critical care time)  Medications Ordered in UC Medications - No data to display  Initial Impression / Assessment and Plan / UC Course  I have reviewed the triage vital signs and the nursing notes.  Pertinent labs & imaging results that were available during my care of the patient were reviewed by me and considered in my medical decision making (see chart for details).     Rash is consistent with contact dermatitis versus insect bites. Will treat with prednisone given that rash has been refractory to steroid cream and antihistamine. No obvious contraindications to prednisone and patient has taken safely before. No signs of bacterial infection. Patient advised to wash linen and clothing in warm water. No concern for scabies but it could be concerning for bed bugs. Discussed this with patient. Patient to follow up with PCP if rash persists or worsens as dermatology referral may be warranted. Patient verbalized understanding and was agreeable with plan.  Final Clinical Impressions(s) / UC Diagnoses   Final diagnoses:  Rash and nonspecific skin eruption  Urticaria     Discharge Instructions      You have been prescribed prednisone which will help alleviate itching and inflammation.  Please follow-up with primary care doctor if symptoms persist or worsen.  Also recommended washing all your linens and clothing in hot water.    ED Prescriptions     Medication Sig Dispense Auth. Provider   predniSONE (DELTASONE) 20 MG tablet Take 2 tablets (40 mg total) by mouth daily for 5 days. 10 tablet Teodora Medici, [place]      PDMP not reviewed this encounter.   Teodora Medici, [place] 06/30/22 [phone]

## 2022-06-30 NOTE — Discharge Instructions (Signed)
You have been prescribed prednisone which will help alleviate itching and inflammation.  Please follow-up with primary care doctor if symptoms persist or worsen.  Also recommended washing all your linens and clothing in hot water.

## 2022-07-02 ENCOUNTER — Ambulatory Visit: Payer: 59 | Admitting: Nurse Practitioner

## 2022-07-02 ENCOUNTER — Telehealth: Payer: Self-pay | Admitting: Nurse Practitioner

## 2022-07-02 NOTE — Telephone Encounter (Signed)
Pt was a no show 07/02/2022 for an OV with Lauren, I have sent out a no show letter

## 2022-07-03 NOTE — Telephone Encounter (Signed)
error 

## 2022-07-07 ENCOUNTER — Telehealth: Payer: Self-pay | Admitting: Nurse Practitioner

## 2022-07-07 NOTE — Telephone Encounter (Signed)
Pt would like you to give her a call about a rash that keep coming back . Pt stated steroids that she was prescribed is not working

## 2022-07-08 ENCOUNTER — Telehealth: Payer: Self-pay

## 2022-07-08 NOTE — Telephone Encounter (Signed)
Called and gave new address to [employer]. Pt will receive new kit within 3-7 days. Sw, cma

## 2022-07-08 NOTE — Telephone Encounter (Signed)
Called and scheduled pt for appt on 07/13/22 '@840am'$ . Sw, cma

## 2022-07-08 NOTE — Telephone Encounter (Signed)
-----   Message from Charyl Dancer, NP sent at 06/24/2022  7:58 AM EDT ----- Can you follow-up on her cologuard test that was ordered?   ----- Message ----- From: SYSTEM Sent: 06/24/2022  12:14 AM EDT To: Charyl Dancer, NP

## 2022-07-08 NOTE — Telephone Encounter (Signed)
Noted! Thank you

## 2022-07-10 NOTE — Progress Notes (Deleted)
   Established Patient Office Visit  Subjective   Patient ID: Kristine Mccarthy, female    DOB: 1971/02/28  Age: 51 y.o. MRN: [id number]  No chief complaint on file.   HPI  Kristine Mccarthy is here to follow-up on rash on her arms. She was first seen on 06/15/22 and was prescribed triamcinolone cream and cetirizine. She went to urgent care on 06/30/22 after no improvement in her symptoms and was started on prednisone.   {History (Optional):23778}  ROS    Objective:     LMP 07/25/2020  {Vitals History (Optional):23777}  Physical Exam   No results found for any visits on 07/13/22.  {Labs (Optional):23779}  The 10-year ASCVD risk score (Arnett DK, et al., 2019) is: 4.1%    Assessment & Plan:   Problem List Items Addressed This Visit   None   No follow-ups on file.    Charyl Dancer, NP

## 2022-07-13 ENCOUNTER — Telehealth: Payer: Self-pay | Admitting: Nurse Practitioner

## 2022-07-13 ENCOUNTER — Encounter: Payer: Self-pay | Admitting: Nurse Practitioner

## 2022-07-13 ENCOUNTER — Ambulatory Visit: Payer: 59 | Admitting: Nurse Practitioner

## 2022-07-13 NOTE — Telephone Encounter (Signed)
2nd no show, fee generated

## 2022-07-13 NOTE — Telephone Encounter (Signed)
3rd no show, generated fee, dismissal generated

## 2022-07-13 NOTE — Telephone Encounter (Signed)
Noted  

## 2022-07-13 NOTE — Telephone Encounter (Signed)
No show letter sent 7.24.23

## 2022-07-27 ENCOUNTER — Other Ambulatory Visit: Payer: Self-pay | Admitting: Nurse Practitioner

## 2022-07-27 NOTE — Telephone Encounter (Signed)
Dismissed from clinic

## 2022-09-17 ENCOUNTER — Ambulatory Visit: Payer: 59 | Admitting: Dermatology

## 2022-09-25 ENCOUNTER — Other Ambulatory Visit: Payer: Self-pay | Admitting: Nurse Practitioner

## 2022-10-14 ENCOUNTER — Other Ambulatory Visit: Payer: Self-pay

## 2022-10-14 DIAGNOSIS — Z1231 Encounter for screening mammogram for malignant neoplasm of breast: Secondary | ICD-10-CM

## 2022-11-24 ENCOUNTER — Ambulatory Visit: Payer: Self-pay | Admitting: *Deleted

## 2022-11-24 ENCOUNTER — Ambulatory Visit
Admission: RE | Admit: 2022-11-24 | Discharge: 2022-11-24 | Disposition: A | Discharge status: Home / Self Care | Payer: No Typology Code available for payment source | Source: Ambulatory Visit | Attending: Obstetrics and Gynecology | Admitting: Obstetrics and Gynecology

## 2022-11-24 VITALS — BP 100/65 | Wt 127.8 lb

## 2022-11-24 DIAGNOSIS — Z1211 Encounter for screening for malignant neoplasm of colon: Secondary | ICD-10-CM

## 2022-11-24 DIAGNOSIS — Z1231 Encounter for screening mammogram for malignant neoplasm of breast: Secondary | ICD-10-CM

## 2022-11-24 DIAGNOSIS — Z1239 Encounter for other screening for malignant neoplasm of breast: Secondary | ICD-10-CM

## 2022-11-24 NOTE — Patient Instructions (Signed)
Explained breast self awareness with June Leap. Patient did not need a Pap smear today due to last Pap smear was 03/26/2022. Let her know based on her Pap smear results that her next Pap smear is due in 3 years. [facility] on mobile unit. Appointment scheduled Tuesday, November 24, 2022 at 1430. Patient aware of appointment and will be there. Let patient know [facility] will follow up with her within the next couple weeks with results of her mammogram by letter or phone. June Leap verbalized understanding.  Shyler Holzman, [place], RN 2:19 PM

## 2022-11-24 NOTE — Progress Notes (Signed)
[name] is a 51 y.o. female who presents to [facility] today with no complaints.    Pap Smear: Pap smear not completed today. Last Pap smear was 03/26/2022 at [facility] clinic and was abnormal - ASCUS with negative HPV . Per patient has history of an abnormal Pap smear 32 years ago that a colposcopy, LEEP, and CKC were completed for follow up. Last Pap smear result is available in Epic.   Physical exam: Breasts Breasts symmetrical. No skin abnormalities bilateral breasts. No nipple retraction bilateral breasts. No nipple discharge bilateral breasts. No lymphadenopathy. No lumps palpated bilateral breasts. No complaints of pain or tenderness on exam.     MS DIGITAL DIAG TOMO BILAT  Result Date: 05/31/2019 CLINICAL DATA:  Short-term follow-up for probably benign right breast masses. Initially, the patient was found to have bilateral breast masses in October 2018, as well as an architectural distortion in the upper outer quadrant of the left breast. Patient underwent biopsy of the architectural distortion and subsequent surgical excision for a complex sclerosing lesion, all benign. She has been having short-term follow ups for the probably benign masses in each breast. Most recent prior ultrasound is dated 04/18/2018. The left breast mass could not be visualized on that exam. The right breast masses were stable. EXAM: DIGITAL DIAGNOSTIC BILATERAL MAMMOGRAM WITH CAD AND TOMO ULTRASOUND RIGHT BREAST COMPARISON:  Previous exam(s). ACR Breast Density Category c: The breast tissue is heterogeneously dense, which may obscure small masses. FINDINGS: There are postsurgical changes in the posterolateral left breast reflecting the resection of the complex sclerosing lesion. Small mass in the medial left breast initially noted on the exam from October 2018 is unchanged. The small masses noted in the right breast on the prior study are not well-defined mammographically. There are no new or  suspicious masses, no new areas of nonsurgical architectural distortion and no new or suspicious calcifications. Mammographic images were processed with CAD. Targeted right breast ultrasound is performed, showing a small cystic appearing mass in the right breast at 6 o'clock, 1 cm from the nipple, measuring 3 x 2 x 3 mm. This appears more cystic than it did on the study dated 04/18/2018, and measures smaller, previously 5 x 2 x 4 mm. In the 9 o'clock position, 1 cm from the nipple, there is a hypoechoic oval mass measuring 5 x 3 x 4 mm, measuring 7 x 2 x 6 mm on the 04/18/2018 exam. There are no new abnormalities. IMPRESSION: 1. No evidence of breast malignancy. 2. Small benign right breast masses, 1 consistent with a fibroadenoma and the other a small cyst, both decreased in size from the most recent prior study. 3. Small mammographic mass in the medial left breast is stable from the October 2018 exam, benign. 4. Postsurgical changes in the lateral posterior left breast from the excision of a complex sclerosing lesion. RECOMMENDATION: Screening mammogram in one year.(Code:SM-B-01Y) I have discussed the findings and recommendations with the patient. Results were also provided in writing at the conclusion of the visit. If applicable, a reminder letter will be sent to the patient regarding the next appointment. BI-RADS CATEGORY  2: Benign. Electronically Signed   By: Kristine Mccarthy M.D.   On: 05/31/2019 10:23   MM Breast Surgical Specimen  Result Date: 11/26/2017 CLINICAL DATA:  Left breast complex sclerosing lesion post surgical excision. EXAM: SPECIMEN RADIOGRAPH OF THE LEFT BREAST COMPARISON:  Previous exam(s). FINDINGS: Status post excision of the left breast. The radioactive seed and  biopsy marker clip are present, completely intact, and were marked for pathology. IMPRESSION: Specimen radiograph of the left breast. Electronically Signed   By: Kristine Mccarthy M.D.   On: 11/26/2017 08:25    Pelvic/Bimanual Pap  is not indicated today per BCCCP guidelines.   Smoking History: Patient is a current smoker. Discussed smoking cessation. Referred to the [facility] Quitline and the free lung cancer screening.   Patient Navigation: Patient education provided. Access to services provided for patient through [facility] program.   Colorectal Cancer Screening: Per patient has never had colonoscopy completed. FIT Test given to patient to complete. No complaints today.    Breast and Cervical Cancer Risk Assessment: Patient has family history of two paternal aunts and one paternal cousin having breast cancer. Patient has no known genetic mutations or history of radiation treatment to the chest before age 45. Per patient has history of cervical dysplasia. Patient has no history of being immunocompromised or DES exposure in-utero.  Risk Scores as of 11/24/2022     Kristine Mccarthy           5-year 0.82 %   Lifetime 6.78 %            Last calculated by Kristine Mccarthy, CMA on 11/24/2022 at  2:15 PM        A: BCCCP exam without pap smear No complaints.  P: Referred patient to the [facility] for a screening mammogram on mobile unit. Appointment scheduled Tuesday, November 24, 2022 at 1430.  Kristine Parish, RN 11/24/2022 2:19 PM

## 2022-11-26 ENCOUNTER — Other Ambulatory Visit: Payer: Self-pay | Admitting: Obstetrics and Gynecology

## 2022-11-26 DIAGNOSIS — R928 Other abnormal and inconclusive findings on diagnostic imaging of breast: Secondary | ICD-10-CM

## 2022-12-03 ENCOUNTER — Ambulatory Visit
Admission: EM | Admit: 2022-12-03 | Discharge: 2022-12-03 | Disposition: A | Discharge status: Home / Self Care | Payer: Medicaid Other | Attending: Physician Assistant | Admitting: Physician Assistant

## 2022-12-03 ENCOUNTER — Other Ambulatory Visit: Payer: Self-pay

## 2022-12-03 ENCOUNTER — Encounter: Payer: Self-pay | Admitting: Emergency Medicine

## 2022-12-03 DIAGNOSIS — A539 Syphilis, unspecified: Secondary | ICD-10-CM

## 2022-12-03 DIAGNOSIS — Z113 Encounter for screening for infections with a predominantly sexual mode of transmission: Secondary | ICD-10-CM | POA: Diagnosis not present

## 2022-12-03 MED ORDER — PENICILLIN G BENZATHINE 1200000 UNIT/2ML IM SUSY
2.4000 10*6.[IU] | PREFILLED_SYRINGE | Freq: Once | INTRAMUSCULAR | Status: AC
  Administered 2022-12-03: 2.4 10*6.[IU] via INTRAMUSCULAR

## 2022-12-03 NOTE — ED Provider Notes (Signed)
[name]    CSN: [id number] Arrival date & time: 12/03/22  1754      History   Chief Complaint Chief Complaint  Patient presents with   Exposure to STD    HPI Kristine Mccarthy is a 51 y.o. female.   Patient here today for evaluation of possible syphilis.  She states that she was going to donate plasma and was informed that her syphilis screening was positive.  She has not had syphilis in the past that she is aware of.  She states that a couple months ago she did have a boil like lesion on her labia.  After contacting her only sexual contact she states that he informed her that he in fact did test positive for syphilis.  She has not had any reported fever.  She does report recent headaches and is unsure if this is related.  She does not have rash elsewhere.  She does not report any other symptoms.  The history is provided by the patient.    Past Medical History:  Diagnosis Date   Abnormal Pap smear 1992   Leep, biopsy   Anxiety    Arthritis    Breast mass in female    Left   Bronchitis    Cervical dysplasia    level 3   PID (acute pelvic inflammatory disease)    Scoliosis    Substance abuse ([place])    Urinary tract infection     Patient Active Problem List   Diagnosis Date Noted   Nevus 03/26/2022   Scoliosis 03/06/2022   Anxiety 03/06/2022   Ectopic pregnancy 10/10/2011    Past Surgical History:  Procedure Laterality Date   BREAST EXCISIONAL BIOPSY Left 11/26/2017   sclerosing lesion   BREAST LUMPECTOMY WITH RADIOACTIVE SEED LOCALIZATION Left 11/26/2017   Procedure: LEFT BREAST LUMPECTOMY WITH RADIOACTIVE SEED LOCALIZATION ERAS PATHWAY;  Surgeon: Erroll Luna, MD;  Location: [facility];  Service: General;  Laterality: Left;   BREAST SURGERY     CERVICAL BIOPSY  W/ LOOP ELECTRODE EXCISION     oever 26 years ago   COLPOSCOPY     over 26 years ago   DILATION AND CURETTAGE OF UTERUS     INDUCED ABORTION     LEEP      OB History     Gravida   5   Para  2   Term  2   Preterm  0   AB  3   Living  2      SAB  1   IAB  1   Ectopic  1   Multiple  0   Live Births  2            Home Medications    Prior to Admission medications   Medication Sig Start Date End Date Taking? Authorizing Provider  albuterol (PROVENTIL HFA;VENTOLIN HFA) 108 (90 Base) MCG/ACT inhaler Inhale 2 puffs into the lungs every 6 (six) hours as needed for wheezing or shortness of breath.    [provider]  cetirizine (ZYRTEC) 10 MG tablet Take 1 tablet (10 mg total) by mouth daily as needed for allergies. 06/15/22   Bonnita Hollow, MD  DULoxetine (CYMBALTA) 30 MG capsule Take 1 capsule (30 mg total) by mouth daily. Patient not taking: Reported on 11/24/2022 03/26/22   Vance Peper A, NP  gabapentin (NEURONTIN) 300 MG capsule Take 1 capsule (300 mg total) by mouth 2 (two) times daily. Take 1 capsule at bedtime for  3 days, then you can increase to twice a day. May make you sleepy 05/26/22   McElwee, Lauren A, NP  ibuprofen (ADVIL,MOTRIN) 200 MG tablet Take 400-800 mg by mouth every 6 (six) hours as needed for headache or moderate pain.    [provider]  omeprazole (PRILOSEC) 40 MG capsule Take 1 capsule (40 mg total) by mouth daily. 03/26/22   McElwee, Lauren A, NP  tiZANidine (ZANAFLEX) 4 MG tablet Take 1 tablet (4 mg total) by mouth every 8 (eight) hours as needed. Patient not taking: Reported on 11/24/2022 03/05/22   Vance Peper A, NP  triamcinolone ointment (KENALOG) 0.5 % Apply 1 Application topically 2 (two) times daily. For moderate to severe eczema.  Do not use for more than 1 week at a time. 06/15/22   Bonnita Hollow, MD    Family History Family History  Problem Relation Age of Onset   Cancer Maternal Grandmother        ovarian   Heart disease Maternal Grandmother    Heart disease Father    Breast cancer Paternal Aunt     Social History Social History   Tobacco Use   Smoking status: Every Day     Packs/day: 1.00    Years: 37.00    Total pack years: 37.00    Types: Cigarettes   Smokeless tobacco: Never   Tobacco comments:    pt states will quit b/c she is pregnant  Vaping Use   Vaping Use: Never used  Substance Use Topics   Alcohol use: Yes    Comment: once or twice a month beer   Drug use: Yes    Types: Cocaine, Marijuana    Comment: last used 5 to 6 years ago     Allergies   Morphine and related   Review of Systems Review of Systems  Constitutional:  Negative for chills and fever.  Eyes:  Negative for discharge and redness.  Gastrointestinal:  Negative for abdominal pain, nausea and vomiting.  Genitourinary:  Negative for dysuria and vaginal discharge.  Musculoskeletal:  Negative for back pain.     Physical Exam Triage Vital Signs ED Triage Vitals  Enc Vitals Group     BP      Pulse      Resp      Temp      Temp src      SpO2      Weight      Height      Head Circumference      Peak Flow      Pain Score      Pain Loc      Pain Edu?      Excl. in [place]?    No data found.  Updated Vital Signs BP 118/70 (BP Location: Left Arm)   Pulse 65   Temp 98.6 F (37 C) (Oral)   Resp 18   LMP 07/25/2020   SpO2 95%   Physical Exam Vitals and nursing note reviewed.  Constitutional:      General: She is not in acute distress.    Appearance: Normal appearance. She is not ill-appearing.  HENT:     Head: Normocephalic and atraumatic.  Eyes:     Conjunctiva/sclera: Conjunctivae normal.  Cardiovascular:     Rate and Rhythm: Normal rate.  Pulmonary:     Effort: Pulmonary effort is normal. No respiratory distress.  Neurological:     Mental Status: She is alert.  Psychiatric:  Mood and Affect: Mood normal.        Behavior: Behavior normal.        Thought Content: Thought content normal.      UC Treatments / Results  Labs (all labs ordered are listed, but only abnormal results are displayed) Labs Reviewed  RPR  HIV ANTIBODY (ROUTINE TESTING  W REFLEX)  HEPATITIS PANEL, ACUTE  CERVICOVAGINAL ANCILLARY ONLY    EKG   Radiology No results found.  Procedures Procedures (including critical care time)  Medications Ordered in UC Medications  penicillin g benzathine (BICILLIN LA) 1200000 UNIT/2ML injection 2.4 Million Units (2.4 Million Units Intramuscular Given 12/03/22 2005)    Initial Impression / Assessment and Plan / UC Course  I have reviewed the triage vital signs and the nursing notes.  Pertinent labs & imaging results that were available during my care of the patient were reviewed by me and considered in my medical decision making (see chart for details).    STD screening ordered.  Will treat with Bicillin given no known history of syphilis.  No current signs or symptoms concerning for secondary syphilis.  Recommended she return in 8 weeks for repeat syphilis screening for titer to determine if treatment was effective.  Advised she use caution with sexual activity in the meantime.  Patient expresses understanding.  Encouraged sooner follow-up with any further concerns.  Final Clinical Impressions(s) / UC Diagnoses   Final diagnoses:  Screening for STD (sexually transmitted disease)  Syphilis   Discharge Instructions   None    ED Prescriptions   None    PDMP not reviewed this encounter.   Francene Finders, PA-C 12/03/22 2007

## 2022-12-03 NOTE — ED Triage Notes (Signed)
Pt here after testing positive for syphilis; pt sts was tested at [facility] and found out last night; pt sts then confirmed exposure from sexual partner

## 2022-12-07 LAB — CERVICOVAGINAL ANCILLARY ONLY
Bacterial Vaginitis (gardnerella): POSITIVE — AB
Candida Glabrata: NEGATIVE
Candida Vaginitis: NEGATIVE
Chlamydia: NEGATIVE
Comment: NEGATIVE
Comment: NEGATIVE
Comment: NEGATIVE
Comment: NEGATIVE
Comment: NEGATIVE
Comment: NORMAL
Neisseria Gonorrhea: NEGATIVE
Trichomonas: NEGATIVE

## 2022-12-07 LAB — HIV ANTIBODY (ROUTINE TESTING W REFLEX): HIV Screen 4th Generation wRfx: NONREACTIVE

## 2022-12-07 LAB — RPR: RPR Ser Ql: REACTIVE — AB

## 2022-12-07 LAB — RPR, QUANT+TP ABS (REFLEX)
Rapid Plasma Reagin, Quant: 1:32 {titer} — ABNORMAL HIGH
T Pallidum Abs: REACTIVE — AB

## 2022-12-08 ENCOUNTER — Telehealth (HOSPITAL_COMMUNITY): Payer: Self-pay | Admitting: Emergency Medicine

## 2022-12-08 MED ORDER — METRONIDAZOLE 500 MG PO TABS: 500.0000 mg | ORAL_TABLET | Freq: Two times a day (BID) | ORAL | 0 refills | Status: AC

## 2022-12-09 ENCOUNTER — Ambulatory Visit
Admission: RE | Admit: 2022-12-09 | Discharge: 2022-12-09 | Disposition: A | Discharge status: Home / Self Care | Payer: No Typology Code available for payment source | Source: Ambulatory Visit | Attending: Obstetrics and Gynecology | Admitting: Obstetrics and Gynecology

## 2022-12-09 DIAGNOSIS — R928 Other abnormal and inconclusive findings on diagnostic imaging of breast: Secondary | ICD-10-CM

## 2022-12-23 ENCOUNTER — Ambulatory Visit (INDEPENDENT_AMBULATORY_CARE_PROVIDER_SITE_OTHER): Payer: Medicaid Other | Admitting: Family Medicine

## 2022-12-23 VITALS — BP 116/74 | HR 82 | Temp 97.9°F | Resp 16 | Ht 63.0 in

## 2022-12-23 DIAGNOSIS — B349 Viral infection, unspecified: Secondary | ICD-10-CM

## 2022-12-23 DIAGNOSIS — F17200 Nicotine dependence, unspecified, uncomplicated: Secondary | ICD-10-CM

## 2022-12-23 DIAGNOSIS — Z7689 Persons encountering health services in other specified circumstances: Secondary | ICD-10-CM | POA: Diagnosis not present

## 2022-12-23 MED ORDER — METHYLPREDNISOLONE 4 MG PO TBPK: ORAL_TABLET | ORAL | 0 refills | Status: AC

## 2022-12-23 NOTE — Progress Notes (Unsigned)
Cough,chills, aches,stuffy nose, little chest congestion.Patient has been like this x 2days

## 2022-12-24 ENCOUNTER — Encounter: Payer: Self-pay | Admitting: Family Medicine

## 2022-12-24 NOTE — Progress Notes (Signed)
New Patient Office Visit  Subjective    Patient ID: Kristine Mccarthy, female    DOB: 08/21/1971  Age: 52 y.o. MRN: [id number]  CC:  Chief Complaint  Patient presents with   Cough   Establish Care    HPI [name] presents to establish care and for complaint of cough and congestion for about 2 days. Denies fever/chills or viral sx. Cough is nonproductive. Patient denies known contacts or exposures. Patient does smoke 1 ppd.    Outpatient Encounter Medications as of 12/23/2022  Medication Sig   methylPREDNISolone (MEDROL DOSEPAK) 4 MG TBPK tablet Utilize po as directed   metroNIDAZOLE (FLAGYL) 500 MG tablet Take 1 tablet (500 mg total) by mouth 2 (two) times daily.   albuterol (PROVENTIL HFA;VENTOLIN HFA) 108 (90 Base) MCG/ACT inhaler Inhale 2 puffs into the lungs every 6 (six) hours as needed for wheezing or shortness of breath.   cetirizine (ZYRTEC) 10 MG tablet Take 1 tablet (10 mg total) by mouth daily as needed for allergies.   DULoxetine (CYMBALTA) 30 MG capsule Take 1 capsule (30 mg total) by mouth daily. (Patient not taking: Reported on 11/24/2022)   gabapentin (NEURONTIN) 300 MG capsule Take 1 capsule (300 mg total) by mouth 2 (two) times daily. Take 1 capsule at bedtime for 3 days, then you can increase to twice a day. May make you sleepy   ibuprofen (ADVIL,MOTRIN) 200 MG tablet Take 400-800 mg by mouth every 6 (six) hours as needed for headache or moderate pain.   omeprazole (PRILOSEC) 40 MG capsule Take 1 capsule (40 mg total) by mouth daily.   tiZANidine (ZANAFLEX) 4 MG tablet Take 1 tablet (4 mg total) by mouth every 8 (eight) hours as needed. (Patient not taking: Reported on 11/24/2022)   triamcinolone ointment (KENALOG) 0.5 % Apply 1 Application topically 2 (two) times daily. For moderate to severe eczema.  Do not use for more than 1 week at a time.   No facility-administered encounter medications on file as of 12/23/2022.    Past Medical History:  Diagnosis Date    Abnormal Pap smear 1992   Leep, biopsy   Anxiety    Arthritis    Breast mass in female    Left   Bronchitis    Cervical dysplasia    level 3   PID (acute pelvic inflammatory disease)    Scoliosis    Substance abuse ([place])    Urinary tract infection     Past Surgical History:  Procedure Laterality Date   BREAST EXCISIONAL BIOPSY Left 11/26/2017   sclerosing lesion   BREAST LUMPECTOMY WITH RADIOACTIVE SEED LOCALIZATION Left 11/26/2017   Procedure: LEFT BREAST LUMPECTOMY WITH RADIOACTIVE SEED LOCALIZATION ERAS PATHWAY;  Surgeon: Erroll Luna, MD;  Location: [facility];  Service: General;  Laterality: Left;   BREAST SURGERY     CERVICAL BIOPSY  W/ LOOP ELECTRODE EXCISION     oever 26 years ago   COLPOSCOPY     over 26 years ago   DILATION AND CURETTAGE OF UTERUS     INDUCED ABORTION     LEEP      Family History  Problem Relation Age of Onset   Cancer Maternal Grandmother        ovarian   Heart disease Maternal Grandmother    Heart disease Father    Breast cancer Paternal Aunt     Social History   Socioeconomic History   Marital status: Divorced    Spouse name: Not on file  Number of children: 2   Years of education: Not on file   Highest education level: Some college, no degree  Occupational History   Not on file  Tobacco Use   Smoking status: Every Day    Packs/day: 1.00    Years: 37.00    Total pack years: 37.00    Types: Cigarettes   Smokeless tobacco: Never   Tobacco comments:    pt states will quit b/c she is pregnant  Vaping Use   Vaping Use: Never used  Substance and Sexual Activity   Alcohol use: Yes    Comment: once or twice a month beer   Drug use: Yes    Types: Cocaine, Marijuana    Comment: last used 5 to 6 years ago   Sexual activity: Yes    Birth control/protection: None, Condom  Other Topics Concern   Not on file  Social History Narrative   Not on file   Social Determinants of Health   Financial Resource Strain: Not on file  Food  Insecurity: No Food Insecurity (11/24/2022)   Hunger Vital Sign    Worried About Running Out of Food in the Last Year: Never true    Ran Out of Food in the Last Year: Never true  Transportation Needs: No Transportation Needs (11/24/2022)   PRAPARE - Hydrologist (Medical): No    Lack of Transportation (Non-Medical): No  Physical Activity: Not on file  Stress: Not on file  Social Connections: Not on file  Intimate Partner Violence: Not on file    Review of Systems  Constitutional:  Negative for chills and fever.  Respiratory:  Positive for cough. Negative for shortness of breath.   All other systems reviewed and are negative.       Objective    BP 116/74   Pulse 82   Temp 97.9 F (36.6 C) (Oral)   Resp 16   Ht '5\' 3"'$  (1.6 m)   LMP 07/25/2020   SpO2 95%   BMI 22.64 kg/m   Physical Exam Vitals and nursing note reviewed.  Constitutional:      General: She is not in acute distress. Cardiovascular:     Rate and Rhythm: Normal rate and regular rhythm.  Pulmonary:     Effort: No respiratory distress.     Breath sounds: Wheezing present.  Abdominal:     Palpations: Abdomen is soft.     Tenderness: There is no abdominal tenderness.  Neurological:     General: No focal deficit present.     Mental Status: She is alert and oriented to person, place, and time.         Assessment & Plan:   1. Viral syndrome Viral swab results pending. Prednisone prescribed to help cough and wheezing.  - COVID-19, Flu A+B and RSV  2. Smoking Discussed reduction/cessation.   3. Encounter to establish care     Return if symptoms worsen or fail to improve.   Becky Sax, MD

## 2022-12-25 ENCOUNTER — Other Ambulatory Visit: Payer: Self-pay | Admitting: Nurse Practitioner

## 2022-12-25 ENCOUNTER — Telehealth: Payer: Self-pay | Admitting: General Practice

## 2022-12-25 LAB — COVID-19, FLU A+B AND RSV
Influenza A, NAA: NOT DETECTED
Influenza B, NAA: NOT DETECTED
RSV, NAA: NOT DETECTED
SARS-CoV-2, NAA: NOT DETECTED

## 2022-12-25 NOTE — Telephone Encounter (Signed)
Negative COVID results given. Patient results "NOT Detected." Caller expressed understanding. ° °

## 2022-12-28 ENCOUNTER — Ambulatory Visit: Payer: Self-pay | Admitting: *Deleted

## 2022-12-28 ENCOUNTER — Other Ambulatory Visit: Payer: Self-pay | Admitting: Family Medicine

## 2022-12-28 MED ORDER — AZITHROMYCIN 250 MG PO TABS: ORAL_TABLET | ORAL | 0 refills | Status: AC

## 2022-12-28 NOTE — Telephone Encounter (Signed)
Patient has been notified

## 2022-12-28 NOTE — Telephone Encounter (Signed)
  Chief Complaint: sinusitis  Symptoms: sinus pressure in face, nasal congestion- thick mucus-yellow/green, blood tinged at times, cough Frequency: patient was seen 12/23/22- started on prednisone- which patient states has relieved some of her symptoms- heavy discharge and sinus pressure started 2 days ago Pertinent Negatives: Patient denies SOB Disposition: '[]'$ ED /'[]'$ Urgent Care (no appt availability in office) / '[]'$ Appointment(In office/virtual)/ '[]'$  [facility]/ '[]'$ Home Care/ '[]'$ Refused Recommended Disposition /'[]'$ [facility]/ '[x]'$  Follow-up with PCP Additional Notes: Patient advised will send note to provider for review since recently seen for the symptoms and getting worse.

## 2022-12-28 NOTE — Telephone Encounter (Signed)
Summary: sinus infection   Patient was last seen 12/23/2022 by Dr. Redmond Pulling, patient states PCP prescribed steroids and she has 2 pills left. Patient experiencing coughing, sinus infection and requesting additional medication.  [place] ([facility]), [place] [place] [[id number]]       Reason for Disposition  Lots of coughing  Answer Assessment - Initial Assessment Questions 1. LOCATION: "Where does it hurt?"      Nasal/eye sinus 2. ONSET: "When did the sinus pain start?"  (e.g., hours, days)      Seen 12/23/21- nasal congestion- increased mucus- 2 days 3. SEVERITY: "How bad is the pain?"   (Scale 1-10; mild, moderate or severe)   - MILD (1-3): doesn't interfere with normal activities    - MODERATE (4-7): interferes with normal activities (e.g., work or school) or awakens from sleep   - SEVERE (8-10): excruciating pain and patient unable to do any normal activities        moderate 4. RECURRENT SYMPTOM: "Have you ever had sinus problems before?" If Yes, ask: "When was the last time?" and "What happened that time?"      Sinus infection-1 year ago- antibiotic 5. NASAL CONGESTION: "Is the nose blocked?" If Yes, ask: "Can you open it or must you breathe through your mouth?"      blocked at times- is able to clear most of the time 6. NASAL DISCHARGE: "Do you have discharge from your nose?" If so ask, "What color?"     Yellow/green- little blood in it 7. FEVER: "Do you have a fever?" If Yes, ask: "What is it, how was it measured, and when did it start?"      Last night- sweating 8. OTHER SYMPTOMS: "Do you have any other symptoms?" (e.g., sore throat, cough, earache, difficulty breathing)     Cough,pian with coughing- ribs  Protocols used: Sinus Pain or Congestion-A-AH

## 2023-05-18 ENCOUNTER — Other Ambulatory Visit: Payer: Self-pay | Admitting: Physician Assistant

## 2023-05-18 DIAGNOSIS — Z1231 Encounter for screening mammogram for malignant neoplasm of breast: Secondary | ICD-10-CM

## 2023-06-09 ENCOUNTER — Other Ambulatory Visit: Payer: Self-pay | Admitting: Physician Assistant

## 2023-06-09 DIAGNOSIS — Z1231 Encounter for screening mammogram for malignant neoplasm of breast: Secondary | ICD-10-CM

## 2023-06-15 ENCOUNTER — Other Ambulatory Visit: Payer: Self-pay | Admitting: Physician Assistant

## 2023-06-15 ENCOUNTER — Ambulatory Visit
Admission: RE | Admit: 2023-06-15 | Discharge: 2023-06-15 | Disposition: A | Discharge status: Home / Self Care | Payer: No Typology Code available for payment source | Source: Ambulatory Visit | Attending: Physician Assistant | Admitting: Physician Assistant

## 2023-06-15 ENCOUNTER — Ambulatory Visit
Admission: RE | Admit: 2023-06-15 | Discharge: 2023-06-15 | Disposition: A | Discharge status: Home / Self Care | Payer: Medicaid Other | Source: Ambulatory Visit | Attending: Physician Assistant | Admitting: Physician Assistant

## 2023-06-15 DIAGNOSIS — Z1231 Encounter for screening mammogram for malignant neoplasm of breast: Secondary | ICD-10-CM

## 2023-06-15 DIAGNOSIS — N6310 Unspecified lump in the right breast, unspecified quadrant: Secondary | ICD-10-CM

## 2023-09-18 ENCOUNTER — Emergency Department (HOSPITAL_BASED_OUTPATIENT_CLINIC_OR_DEPARTMENT_OTHER)
Admission: EM | Admit: 2023-09-18 | Discharge: 2023-09-18 | Disposition: A | Discharge status: Home / Self Care | Payer: Medicaid Other | Attending: Emergency Medicine | Admitting: Emergency Medicine

## 2023-09-18 ENCOUNTER — Other Ambulatory Visit: Payer: Self-pay

## 2023-09-18 ENCOUNTER — Encounter (HOSPITAL_BASED_OUTPATIENT_CLINIC_OR_DEPARTMENT_OTHER): Payer: Self-pay

## 2023-09-18 DIAGNOSIS — S41112A Laceration without foreign body of left upper arm, initial encounter: Secondary | ICD-10-CM | POA: Insufficient documentation

## 2023-09-18 DIAGNOSIS — T7491XA Unspecified adult maltreatment, confirmed, initial encounter: Secondary | ICD-10-CM

## 2023-09-18 DIAGNOSIS — T7411XA Adult physical abuse, confirmed, initial encounter: Secondary | ICD-10-CM | POA: Diagnosis not present

## 2023-09-18 MED ORDER — BACITRACIN ZINC 500 UNIT/GM EX OINT
TOPICAL_OINTMENT | Freq: Two times a day (BID) | CUTANEOUS | Status: DC
  Administered 2023-09-18: 31.5 via TOPICAL
  Filled 2023-09-18: qty 28.35

## 2023-09-18 MED ORDER — CEPHALEXIN 500 MG PO CAPS: 500.0000 mg | ORAL_CAPSULE | Freq: Four times a day (QID) | ORAL | 0 refills | Status: AC

## 2023-09-18 MED ORDER — CEPHALEXIN 250 MG PO CAPS
500.0000 mg | ORAL_CAPSULE | Freq: Once | ORAL | Status: AC
  Administered 2023-09-18: 500 mg via ORAL
  Filled 2023-09-18: qty 2
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
## 2023-09-18 NOTE — ED Notes (Signed)
Wound care provided. Bacitracin applied. Bandage applied.
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
## 2023-10-20 ENCOUNTER — Other Ambulatory Visit: Payer: No Typology Code available for payment source

## 2023-11-30 ENCOUNTER — Ambulatory Visit
Admission: RE | Admit: 2023-11-30 | Discharge: 2023-11-30 | Disposition: A | Discharge status: Home / Self Care | Payer: No Typology Code available for payment source | Source: Ambulatory Visit | Attending: Physician Assistant | Admitting: Physician Assistant

## 2023-11-30 ENCOUNTER — Ambulatory Visit
Admission: RE | Admit: 2023-11-30 | Discharge: 2023-11-30 | Disposition: A | Discharge status: Home / Self Care | Payer: Medicaid Other | Source: Ambulatory Visit | Attending: Physician Assistant | Admitting: Physician Assistant

## 2023-11-30 DIAGNOSIS — N6310 Unspecified lump in the right breast, unspecified quadrant: Secondary | ICD-10-CM

## 2024-04-11 ENCOUNTER — Encounter (HOSPITAL_COMMUNITY): Payer: Self-pay | Admitting: Emergency Medicine

## 2024-04-11 ENCOUNTER — Emergency Department (HOSPITAL_COMMUNITY)

## 2024-04-11 ENCOUNTER — Ambulatory Visit: Admission: EM | Admit: 2024-04-11 | Discharge: 2024-04-11 | Disposition: A | Discharge status: Home / Self Care

## 2024-04-11 ENCOUNTER — Emergency Department (HOSPITAL_COMMUNITY)
Admission: EM | Admit: 2024-04-11 | Discharge: 2024-04-12 | Discharge status: Against Medical Advice | Attending: Emergency Medicine | Admitting: Emergency Medicine

## 2024-04-11 DIAGNOSIS — F1090 Alcohol use, unspecified, uncomplicated: Secondary | ICD-10-CM

## 2024-04-11 DIAGNOSIS — R45851 Suicidal ideations: Secondary | ICD-10-CM | POA: Diagnosis not present

## 2024-04-11 DIAGNOSIS — R200 Anesthesia of skin: Secondary | ICD-10-CM | POA: Diagnosis not present

## 2024-04-11 DIAGNOSIS — R29810 Facial weakness: Secondary | ICD-10-CM | POA: Insufficient documentation

## 2024-04-11 DIAGNOSIS — F322 Major depressive disorder, single episode, severe without psychotic features: Secondary | ICD-10-CM | POA: Diagnosis not present

## 2024-04-11 DIAGNOSIS — M5440 Lumbago with sciatica, unspecified side: Secondary | ICD-10-CM | POA: Insufficient documentation

## 2024-04-11 DIAGNOSIS — Z5321 Procedure and treatment not carried out due to patient leaving prior to being seen by health care provider: Secondary | ICD-10-CM | POA: Insufficient documentation

## 2024-04-11 DIAGNOSIS — R4781 Slurred speech: Secondary | ICD-10-CM | POA: Diagnosis not present

## 2024-04-11 DIAGNOSIS — M4120 Other idiopathic scoliosis, site unspecified: Secondary | ICD-10-CM | POA: Insufficient documentation

## 2024-04-11 LAB — COMPREHENSIVE METABOLIC PANEL WITH GFR
ALT: 19 U/L (ref 0–44)
AST: 19 U/L (ref 15–41)
Albumin: 3.9 g/dL (ref 3.5–5.0)
Alkaline Phosphatase: 46 U/L (ref 38–126)
Anion gap: 9 (ref 5–15)
BUN: 13 mg/dL (ref 6–20)
CO2: 23 mmol/L (ref 22–32)
Calcium: 9.5 mg/dL (ref 8.9–10.3)
Chloride: 108 mmol/L (ref 98–111)
Creatinine, Ser: 0.69 mg/dL (ref 0.44–1.00)
GFR, Estimated: >60 mL/min (ref >60)
Glucose, Bld: 91 mg/dL (ref 70–99)
Potassium: 4.3 mmol/L (ref 3.5–5.1)
Sodium: 140 mmol/L (ref 135–145)
Total Bilirubin: 0.6 mg/dL (ref 0.0–1.2)
Total Protein: 7 g/dL (ref 6.5–8.1)

## 2024-04-11 LAB — CBC WITH DIFFERENTIAL/PLATELET
Abs Immature Granulocytes: 0.04 10*3/uL (ref 0.00–0.07)
Basophils Absolute: 0 10*3/uL (ref 0.0–0.1)
Basophils Relative: 1 %
Eosinophils Absolute: 0.1 10*3/uL (ref 0.0–0.5)
Eosinophils Relative: 1 %
HCT: 44.2 % (ref 36.0–46.0)
Hemoglobin: 14.7 g/dL (ref 12.0–15.0)
Immature Granulocytes: 1 %
Lymphocytes Relative: 33 %
Lymphs Abs: 2.3 10*3/uL (ref 0.7–4.0)
MCH: 31.1 pg (ref 26.0–34.0)
MCHC: 33.3 g/dL (ref 30.0–36.0)
MCV: 93.4 fL (ref 80.0–100.0)
Monocytes Absolute: 0.5 10*3/uL (ref 0.1–1.0)
Monocytes Relative: 7 %
Neutro Abs: 3.9 10*3/uL (ref 1.7–7.7)
Neutrophils Relative %: 57 %
Platelets: 268 10*3/uL (ref 150–400)
RBC: 4.73 MIL/uL (ref 3.87–5.11)
RDW: 11.7 % (ref 11.5–15.5)
WBC: 6.7 10*3/uL (ref 4.0–10.5)
nRBC: 0 % (ref 0.0–0.2)

## 2024-04-11 LAB — URINALYSIS, ROUTINE W REFLEX MICROSCOPIC
Bilirubin Urine: NEGATIVE
Glucose, UA: NEGATIVE mg/dL
Hgb urine dipstick: NEGATIVE
Ketones, ur: NEGATIVE mg/dL
Leukocytes,Ua: NEGATIVE
Nitrite: NEGATIVE
Protein, ur: NEGATIVE mg/dL
Specific Gravity, Urine: 1.003 — ABNORMAL LOW (ref 1.005–1.030)
pH: 6 (ref 5.0–8.0)

## 2024-04-11 LAB — CBG MONITORING, ED: Glucose-Capillary: 101 mg/dL — ABNORMAL HIGH (ref 70–99)

## 2024-04-11 LAB — ETHANOL: Alcohol, Ethyl (B): 57 mg/dL — ABNORMAL HIGH (ref <15)

## 2024-04-11 NOTE — Discharge Instructions (Addendum)
 Go immediately [facility] for evaluation of what I suspect is Bell's Palsy however given you slurring speech, weakness, and passive suicidal thoughts, you need evaluation in setting in the emergency department

## 2024-04-11 NOTE — ED Notes (Signed)
 Patient is being discharged from the Urgent Care and sent to the Emergency Department via POV . Per Susa Engman, NP, patient is in need of higher level of care due to right side facial weakness. Patient is aware and verbalizes understanding of plan of care.  Vitals:   04/11/24 1705  BP: 137/81  Pulse: 84  Resp: 18  Temp: 98.5 F (36.9 C)  SpO2: 95%

## 2024-04-11 NOTE — ED Triage Notes (Signed)
"  I noticed a few days ago that my right side of my face isn't acting correctly, I can't close my right eye completely and problems with my mouth on that side". No previous cold to this. 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

## 2024-04-11 NOTE — ED Provider Triage Note (Signed)
 Emergency Medicine Provider Triage Evaluation Note  Kristine Mccarthy , a 53 y.o. female  was evaluated in triage.  Pt complains of difficulty doing her make-up yesterday evening around 4 PM.  Also noted difficulty smiling this morning when she woke up (more than 4 hours ago).  Denies any chest pain or shortness of breath.  Review of Systems  Positive: As above Negative: As above  Physical Exam  BP (!) 142/91 (BP Location: Left Arm)   Pulse 78   Temp 98.4 F (36.9 C) (Oral)   Resp 18   LMP 07/25/2020   SpO2 96%  Gen:   Awake, no distress   Resp:  Normal effort  MSK:   Moves extremities without difficulty  Other:  Asymmetric smile with droop on the right side.  Unable to raise right eyebrow.  No drift in the upper extremities bilaterally.  Able to walk without difficulty.  No aphasia noted.  Medical Decision Making  Medically screening exam initiated at 6:12 PM.  Appropriate orders placed.  Kristine Mccarthy was informed that the remainder of the evaluation will be completed by another provider, this initial triage assessment does not replace that evaluation, and the importance of remaining in the ED until their evaluation is complete.  Patient with onset of right sided facial droop that started yesterday at 4 PM.  She is outside the stroke window.  Will proceed with CT head and labs.  Suspect Bell's palsy as she is unable to raise the eyebrow on the right side as well, but will proceed with stroke workup.   Kristine Catena, PA-C 04/11/24 1814
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
## 2024-04-11 NOTE — ED Provider Notes (Signed)
 [name]    CSN: [id number] Arrival date & time: 04/11/24  1654      History   Chief Complaint Chief Complaint  Patient presents with   Facial Droop    HPI Kristine Mccarthy is a 53 y.o. female.  Patient with a history of cocaine use disorder, alcohol use disorder, depression, anxiety presents today for evaluation of 3 days of right-sided facial drooping, slurring of speech, and facial weakness.  On triage patient endorsed that she has been having suicidal thoughts and worsening depression to several things going on in her life at present.  Patient reports of distant history of greater than 15 years of a previous suicide attempt.  She is here today with a friend and reports she is supposed to be going to her mother's house as she has plans to go to a rehab program.  Patient last drank a 40 ounce of beer today she drinks daily dependent on alcohol.  Reports she has no intent of killing herself but is visibly pressed and appears hopeless. Patient reports her current symptoms started a couple days ago and have worsened.  She has lost most of the sensation on the right side of her face.  Patient has no prior history of Bell's palsy.  She has no lower extremity weakness but reports she has had an on and off headache and normally have headaches.  Denies any changes visual acuity.  Patient is here accompanied by a friend who brought her here, Dean Every.  Patient gave permission for friend Dean Every to come back to the triage area.  Discussed with Dean Every that patient will need to be evaluated in the setting of the emergency department due to positive suicide questions, active substance use and current neurological symptoms which I suspect is likely Bell's palsy however given patient's use of substances in addition to current changes in neurological status she will need evaluation in the setting of the emergency department. Patient and friend advised that he is willing to transport patient directly  to the [facility] Emergency Department for evaluation. This Clinical research associate offered EMS offer transport and patient declined and requested her friend take her to the hospital. Patient mother notified of ER evaluation by nursing.  Patient is ambulatory stable for transport by friend. Past Medical History:  Diagnosis Date   Abnormal Pap smear 1992   Leep, biopsy   Anxiety    Arthritis    Breast mass in female    Left   Bronchitis    Cervical dysplasia    level 3   PID (acute pelvic inflammatory disease)    Scoliosis    Substance abuse (HCC)    Urinary tract infection     Patient Active Problem List   Diagnosis Date Noted   Acute back pain with sciatica 04/11/2024   Idiopathic scoliosis and kyphoscoliosis 04/11/2024   Nevus 03/26/2022   Scoliosis 03/06/2022   Anxiety 03/06/2022   Ectopic pregnancy 10/10/2011    Past Surgical History:  Procedure Laterality Date   BREAST EXCISIONAL BIOPSY Left 11/26/2017   sclerosing lesion   BREAST LUMPECTOMY WITH RADIOACTIVE SEED LOCALIZATION Left 11/26/2017   Procedure: LEFT BREAST LUMPECTOMY WITH RADIOACTIVE SEED LOCALIZATION ERAS PATHWAY;  Surgeon: Sim Dryer, MD;  Location: [facility] OR;  Service: General;  Laterality: Left;   BREAST SURGERY     CERVICAL BIOPSY  W/ LOOP ELECTRODE EXCISION     oever 26 years ago   COLPOSCOPY     over 26 years ago   DILATION  AND CURETTAGE OF UTERUS     INDUCED ABORTION     LEEP      OB History     Gravida  5   Para  2   Term  2   Preterm  0   AB  3   Living  2      SAB  1   IAB  1   Ectopic  1   Multiple  0   Live Births  2            Home Medications    Prior to Admission medications   Medication Sig Start Date End Date Taking? Authorizing Provider  atorvastatin (LIPITOR) 10 MG tablet Take 10 mg by mouth at bedtime. 11/29/23  Yes [provider]  HYDROcodone -acetaminophen  (NORCO) 10-325 MG tablet Take 1 tablet by mouth 4 (four) times daily as needed. 10/15/23  Yes [provider]  metroNIDAZOLE  (FLAGYL ) 500 MG tablet Take 1 tablet (500 mg total) by mouth 2 (two) times daily. 12/08/22   LampteyDonley Furth, MD  albuterol  (PROVENTIL  HFA;VENTOLIN  HFA) 108 (90 Base) MCG/ACT inhaler Inhale 2 puffs into the lungs every 6 (six) hours as needed for wheezing or shortness of breath.    [provider]  cephALEXin  (KEFLEX ) 500 MG capsule Take 1 capsule (500 mg total) by mouth 4 (four) times daily. 09/18/23   Kelsey Patricia, MD  cetirizine  (ZYRTEC ) 10 MG tablet Take 1 tablet (10 mg total) by mouth daily as needed for allergies. 06/15/22   Catheryn Cluck, MD  DULoxetine  (CYMBALTA ) 30 MG capsule Take 1 capsule (30 mg total) by mouth daily. Patient not taking: Reported on 11/24/2022 03/26/22   Odette Benjamin, NP  gabapentin  (NEURONTIN ) 300 MG capsule Take 1 capsule (300 mg total) by mouth 2 (two) times daily. Take 1 capsule at bedtime for 3 days, then you can increase to twice a day. May make you sleepy 05/26/22   McElwee, Lauren A, NP  ibuprofen  (ADVIL ,MOTRIN ) 200 MG tablet Take 400-800 mg by mouth every 6 (six) hours as needed for headache or moderate pain.    [provider]  methylPREDNISolone  (MEDROL  DOSEPAK) 4 MG TBPK tablet Utilize po as directed 12/23/22   Abraham Abo, MD  omeprazole  (PRILOSEC) 40 MG capsule Take 1 capsule (40 mg total) by mouth daily. 03/26/22   McElwee, Lauren A, NP  tiZANidine  (ZANAFLEX ) 4 MG tablet Take 1 tablet (4 mg total) by mouth every 8 (eight) hours as needed. Patient not taking: Reported on 11/24/2022 03/05/22   Odette Benjamin, NP  triamcinolone  ointment (KENALOG ) 0.5 % Apply 1 Application topically 2 (two) times daily. For moderate to severe eczema.  Do not use for more than 1 week at a time. 06/15/22   Catheryn Cluck, MD    Family History Family History  Problem Relation Age of Onset   Cancer Maternal Grandmother        ovarian   Heart disease Maternal Grandmother    Heart disease Father    Breast cancer Paternal  Aunt     Social History Social History   Tobacco Use   Smoking status: Every Day    Current packs/day: 1.00    Average packs/day: 1 pack/day for 37.0 years (37.0 ttl pk-yrs)    Types: Cigarettes   Smokeless tobacco: Never   Tobacco comments:    pt states will quit b/c she is pregnant  Vaping Use   Vaping status: Never Used  Substance Use Topics  Alcohol use: Yes    Comment: Alot recently.   Drug use: Yes    Types: Cocaine, Marijuana    Comment: Last used Cocaine yesterday     Allergies   Morphine and codeine   Review of Systems Review of Systems Pertinent negatives listed in HPI   Physical Exam Triage Vital Signs ED Triage Vitals  Encounter Vitals Group     BP 04/11/24 1705 137/81     Systolic BP Percentile --      Diastolic BP Percentile --      Pulse Rate 04/11/24 1705 84     Resp 04/11/24 1705 18     Temp 04/11/24 1705 98.5 F (36.9 C)     Temp Source 04/11/24 1705 Oral     SpO2 04/11/24 1705 95 %     Weight 04/11/24 1701 127 lb 13.9 oz (58 kg)     Height 04/11/24 1701 5\' 3"  (1.6 m)     Head Circumference --      Peak Flow --      Pain Score 04/11/24 1657 0     Pain Loc --      Pain Education --      Exclude from Growth Chart --    No data found.  Updated Vital Signs BP 137/81 (BP Location: Left Arm)   Pulse 84   Temp 98.5 F (36.9 C) (Oral)   Resp 18   Ht 5\' 3"  (1.6 m)   Wt 127 lb 13.9 oz (58 kg)   LMP 07/25/2020   SpO2 95%   BMI 22.65 kg/m   Visual Acuity Right Eye Distance:   Left Eye Distance:   Bilateral Distance:    Right Eye Near:   Left Eye Near:    Bilateral Near:     Physical Exam HENT:     Head: Normocephalic.  Eyes:     General: No visual field deficit.    Extraocular Movements: Extraocular movements intact.     Comments: Right eyelid drooping  Cardiovascular:     Rate and Rhythm: Normal rate and regular rhythm.  Pulmonary:     Effort: Pulmonary effort is normal.     Breath sounds: Normal breath sounds.   Musculoskeletal:     Cervical back: Normal range of motion.  Neurological:     Mental Status: She is alert and oriented to person, place, and time.     Cranial Nerves: Facial asymmetry present. No dysarthria.     Sensory: Sensory deficit (right facial nerve) present.     Motor: Weakness (rigth side face) present.     Coordination: Coordination normal. Rapid alternating movements normal.     Gait: Gait is intact.  Psychiatric:        Attention and Perception: Attention normal.        Mood and Affect: Mood is anxious and depressed. Affect is tearful.        Speech: Speech normal.        Behavior: Behavior is cooperative.        Thought Content: Thought content is not paranoid or delusional. Thought content includes suicidal ideation. Thought content does not include homicidal ideation. Thought content does not include homicidal or suicidal plan.        Judgment: Judgment normal. Judgment is not impulsive.      UC Treatments / Results  Labs (all labs ordered are listed, but only abnormal results are displayed) Labs Reviewed - No data to display  EKG   Radiology No results  found.  Procedures Procedures (including critical care time)  Medications Ordered in UC Medications - No data to display  Initial Impression / Assessment and Plan / UC Course  I have reviewed the triage vital signs and the nursing notes.  Pertinent labs & imaging results that were available during my care of the patient were reviewed by me and considered in my medical decision making (see chart for details).    Verbalized understanding of the importance of evaluation in the setting of the emergency department.  Patient reassured this Clinical research associate that she has no intent or access to means to end her life but reports she has been depressed mostly related to a relationship she is attempting to get out of assistance of her friend who is here today and her mother.  Patient is future oriented as she reports she has  tried to get into rehab although has not gone through any type of a medical detox.  Advised she will likely require medical clearance in order to get into any type of treatment program given new symptoms of facial drooping which is likely Bell's palsy.  Patient requires a higher level of evaluation to ensure safety and medical clearance as patient is a daily heavy drinker and polysubstance user.  Patient verbalized understanding to go directly to [facility] emergency department be driven by her friend Dean Every. Final Clinical Impressions(s) / UC Diagnoses   Final diagnoses:  Facial numbness  Alcohol use disorder  Passive suicidal ideations  Current severe episode of major depressive disorder without psychotic features, unspecified whether recurrent (HCC)  Slurring of speech     Discharge Instructions      Go immediately [facility] Emergency Department for evaluation of what I suspect is Bell's Palsy however given you slurring speech, weakness, and passive suicidal thoughts, you need evaluation in setting in the emergency department     ED Prescriptions   None    PDMP not reviewed this encounter.   Buena Carmine, NP 04/11/24 [phone]

## 2024-04-11 NOTE — ED Triage Notes (Signed)
 Pt began having slurred speech yesterday at 1600. After speaking with the pt she states that the left sided facial droop is new and started 1 hour ago before seen at [facility]. Pt taken to triage

## 2024-04-12 NOTE — ED Notes (Signed)
 Called for vitals multiple times, no response.

## 2024-04-21 ENCOUNTER — Emergency Department (HOSPITAL_COMMUNITY)

## 2024-04-21 ENCOUNTER — Emergency Department (HOSPITAL_COMMUNITY)
Admission: EM | Admit: 2024-04-21 | Discharge: 2024-04-21 | Disposition: A | Discharge status: Home / Self Care | Attending: Student | Admitting: Student

## 2024-04-21 ENCOUNTER — Other Ambulatory Visit: Payer: Self-pay

## 2024-04-21 DIAGNOSIS — F17210 Nicotine dependence, cigarettes, uncomplicated: Secondary | ICD-10-CM | POA: Diagnosis not present

## 2024-04-21 DIAGNOSIS — R29810 Facial weakness: Secondary | ICD-10-CM | POA: Insufficient documentation

## 2024-04-21 DIAGNOSIS — R200 Anesthesia of skin: Secondary | ICD-10-CM | POA: Diagnosis present

## 2024-04-21 DIAGNOSIS — G510 Bell's palsy: Secondary | ICD-10-CM

## 2024-04-21 LAB — COMPREHENSIVE METABOLIC PANEL WITH GFR
ALT: 19 U/L (ref 0–44)
AST: 24 U/L (ref 15–41)
Albumin: 3.6 g/dL (ref 3.5–5.0)
Alkaline Phosphatase: 50 U/L (ref 38–126)
Anion gap: 9 (ref 5–15)
BUN: 11 mg/dL (ref 6–20)
CO2: 25 mmol/L (ref 22–32)
Calcium: 9.2 mg/dL (ref 8.9–10.3)
Chloride: 107 mmol/L (ref 98–111)
Creatinine, Ser: 0.7 mg/dL (ref 0.44–1.00)
GFR, Estimated: >60 mL/min (ref >60)
Glucose, Bld: 145 mg/dL — ABNORMAL HIGH (ref 70–99)
Potassium: 3.4 mmol/L — ABNORMAL LOW (ref 3.5–5.1)
Sodium: 141 mmol/L (ref 135–145)
Total Bilirubin: 0.6 mg/dL (ref 0.0–1.2)
Total Protein: 6.4 g/dL — ABNORMAL LOW (ref 6.5–8.1)

## 2024-04-21 LAB — DIFFERENTIAL
Abs Immature Granulocytes: 0.02 10*3/uL (ref 0.00–0.07)
Basophils Absolute: 0 10*3/uL (ref 0.0–0.1)
Basophils Relative: 0 %
Eosinophils Absolute: 0.1 10*3/uL (ref 0.0–0.5)
Eosinophils Relative: 2 %
Immature Granulocytes: 0 %
Lymphocytes Relative: 31 %
Lymphs Abs: 1.8 10*3/uL (ref 0.7–4.0)
Monocytes Absolute: 0.4 10*3/uL (ref 0.1–1.0)
Monocytes Relative: 7 %
Neutro Abs: 3.5 10*3/uL (ref 1.7–7.7)
Neutrophils Relative %: 60 %

## 2024-04-21 LAB — ETHANOL: Alcohol, Ethyl (B): <15 mg/dL (ref <15)

## 2024-04-21 LAB — CBC
HCT: 38.2 % (ref 36.0–46.0)
Hemoglobin: 12.9 g/dL (ref 12.0–15.0)
MCH: 31.2 pg (ref 26.0–34.0)
MCHC: 33.8 g/dL (ref 30.0–36.0)
MCV: 92.3 fL (ref 80.0–100.0)
Platelets: 227 10*3/uL (ref 150–400)
RBC: 4.14 MIL/uL (ref 3.87–5.11)
RDW: 11.9 % (ref 11.5–15.5)
WBC: 5.8 10*3/uL (ref 4.0–10.5)
nRBC: 0 % (ref 0.0–0.2)

## 2024-04-21 LAB — PROTIME-INR
INR: 1 (ref 0.8–1.2)
Prothrombin Time: 13.1 s (ref 11.4–15.2)

## 2024-04-21 LAB — APTT: aPTT: 33 s (ref 24–36)

## 2024-04-21 MED ORDER — LORAZEPAM 2 MG/ML IJ SOLN
0.5000 mg | Freq: Once | INTRAMUSCULAR | Status: AC
Start: 2024-04-21 — End: 2024-04-21
  Administered 2024-04-21: 0.5 mg via INTRAVENOUS
  Filled 2024-04-21: qty 1

## 2024-04-21 MED ORDER — LORAZEPAM 2 MG/ML IJ SOLN
0.5000 mg | Freq: Once | INTRAMUSCULAR | Status: AC
  Administered 2024-04-21: 0.5 mg via INTRAVENOUS
  Filled 2024-04-21: qty 1

## 2024-04-21 NOTE — ED Triage Notes (Signed)
 Patient reports R arm and hand pain and hand swelling since yesterday. Denies injury. Dx with Bell's Palsy yesterday and started taking Valtrex at 0200 today.

## 2024-04-21 NOTE — ED Provider Notes (Signed)
 [facility] Provider Note   CSN: [id number] Arrival date & time: 04/21/24  1308     History  Chief Complaint  Patient presents with   Arm Pain    Kristine Mccarthy is a 53 y.o. female history of cigarette use presented with right arm numbness that began today along with right facial drooping since last week.  Patient went to the urgent care was diagnosed with Bell's palsy last week and just picked up the prescription for Valtrex today.  Patient states that she woke up today and her right hand feels numb and she has right shoulder pain as well associated with this.  Patient says she has been slurring her words due to the Bell's palsy and that the facial droop has been persistent.  Patient states that she feels that she will be dropping objects in her right hand due to the numbness.  Patient denies any previous history of blood clots, chest pain, shortness of breath.  Home Medications Prior to Admission medications   Medication Sig Start Date End Date Taking? Authorizing Provider  metroNIDAZOLE  (FLAGYL ) 500 MG tablet Take 1 tablet (500 mg total) by mouth 2 (two) times daily. 12/08/22   LampteyDonley Furth, MD  albuterol  (PROVENTIL  HFA;VENTOLIN  HFA) 108 (90 Base) MCG/ACT inhaler Inhale 2 puffs into the lungs every 6 (six) hours as needed for wheezing or shortness of breath.    [provider]  atorvastatin (LIPITOR) 10 MG tablet Take 10 mg by mouth at bedtime. 11/29/23   [provider]  cephALEXin  (KEFLEX ) 500 MG capsule Take 1 capsule (500 mg total) by mouth 4 (four) times daily. 09/18/23   Kelsey Patricia, MD  cetirizine  (ZYRTEC ) 10 MG tablet Take 1 tablet (10 mg total) by mouth daily as needed for allergies. 06/15/22   Catheryn Cluck, MD  DULoxetine  (CYMBALTA ) 30 MG capsule Take 1 capsule (30 mg total) by mouth daily. Patient not taking: Reported on 11/24/2022 03/26/22   Odette Benjamin, NP  gabapentin  (NEURONTIN ) 300 MG capsule  Take 1 capsule (300 mg total) by mouth 2 (two) times daily. Take 1 capsule at bedtime for 3 days, then you can increase to twice a day. May make you sleepy 05/26/22   McElwee, Lauren A, NP  HYDROcodone -acetaminophen  (NORCO) 10-325 MG tablet Take 1 tablet by mouth 4 (four) times daily as needed. 10/15/23   [provider]  ibuprofen  (ADVIL ,MOTRIN ) 200 MG tablet Take 400-800 mg by mouth every 6 (six) hours as needed for headache or moderate pain.    [provider]  methylPREDNISolone  (MEDROL  DOSEPAK) 4 MG TBPK tablet Utilize po as directed 12/23/22   Abraham Abo, MD  omeprazole  (PRILOSEC) 40 MG capsule Take 1 capsule (40 mg total) by mouth daily. 03/26/22   McElwee, Lauren A, NP  tiZANidine  (ZANAFLEX ) 4 MG tablet Take 1 tablet (4 mg total) by mouth every 8 (eight) hours as needed. Patient not taking: Reported on 11/24/2022 03/05/22   Odette Benjamin, NP  triamcinolone  ointment (KENALOG ) 0.5 % Apply 1 Application topically 2 (two) times daily. For moderate to severe eczema.  Do not use for more than 1 week at a time. 06/15/22   Catheryn Cluck, MD      Allergies    Morphine and codeine    Review of Systems   Review of Systems  Physical Exam Updated Vital Signs BP (!) 154/121 (BP Location: Left Arm)   Pulse 78   Temp 98.1 F (36.7  C)   Resp 18   Ht 5\' 3"  (1.6 m)   Wt 61.2 kg   LMP 07/25/2020   SpO2 99%   BMI 23.91 kg/m  Physical Exam Vitals reviewed.  Constitutional:      General: She is not in acute distress. HENT:     Head: Normocephalic and atraumatic.     Ears:     Comments: No vesicles on either tympanic membrane's Eyes:     Extraocular Movements: Extraocular movements intact.     Conjunctiva/sclera: Conjunctivae normal.     Pupils: Pupils are equal, round, and reactive to light.  Cardiovascular:     Rate and Rhythm: Normal rate and regular rhythm.     Pulses: Normal pulses.     Heart sounds: Normal heart sounds.     Comments: 2+ bilateral  radial/dorsalis pedis pulses with regular rate Pulmonary:     Effort: Pulmonary effort is normal. No respiratory distress.     Breath sounds: Normal breath sounds.  Abdominal:     Palpations: Abdomen is soft.     Tenderness: There is no abdominal tenderness. There is no guarding or rebound.  Musculoskeletal:        General: Normal range of motion.     Cervical back: Normal range of motion and neck supple.     Comments: 5 out of 5 bilateral grip/leg extension strength  Skin:    General: Skin is warm and dry.     Capillary Refill: Capillary refill takes less than 2 seconds.  Neurological:     Mental Status: She is alert and oriented to person, place, and time.     Comments: Sensation intact in all 4 limbs Able to ambulate without difficulty Right-sided facial drooping noted No slurring of words Visual fields intact bilaterally Decreased sensation to right hand when compared to left hand when I palpate Able to raise both eyebrows without difficulty  Psychiatric:        Mood and Affect: Mood normal.     ED Results / Procedures / Treatments   Labs (all labs ordered are listed, but only abnormal results are displayed) Labs Reviewed  PROTIME-INR  APTT  CBC  DIFFERENTIAL  COMPREHENSIVE METABOLIC PANEL WITH GFR  ETHANOL    EKG None  Radiology DG Hand Complete Right Result Date: 04/21/2024 CLINICAL DATA:  Right arm and hand pain and swelling since yesterday. No known injury. Diagnosed with Bell's palsy yesterday and started taking Valtrex at 2 a.m. today. EXAM: RIGHT HAND - COMPLETE 3+ VIEW COMPARISON:  None Available. FINDINGS: Normal bone mineralization. 1.5 mm ulnar negative variance. Mild index finger DIP joint space narrowing. No acute fracture dislocation. No cortical erosion. IMPRESSION: Mild index finger DIP osteoarthritis. Electronically Signed   By: Bertina Broccoli M.D.   On: 04/21/2024 11:14    Procedures .Critical Care  Performed by: Denese Finn,  PA-C Authorized by: Denese Finn, PA-C   Critical care provider statement:    Critical care time (minutes):  30   Critical care time was exclusive of:  Separately billable procedures and treating other patients   Critical care was necessary to treat or prevent imminent or life-threatening deterioration of the following conditions: tPA consideration.   Critical care was time spent personally by me on the following activities:  Blood draw for specimens, development of treatment plan with patient or surrogate, evaluation of patient's response to treatment, examination of patient, obtaining history from patient or surrogate, review of old charts, re-evaluation of patient's condition, pulse  oximetry, ordering and review of radiographic studies, ordering and review of laboratory studies and ordering and performing treatments and interventions   I assumed direction of critical care for this patient from another provider in my specialty: no     Care discussed with comment:  Oncoming team     Medications Ordered in ED Medications  LORazepam  (ATIVAN ) injection 0.5 mg (has no administration in time range)    ED Course/ Medical Decision Making/ A&P                                 Medical Decision Making Amount and/or Complexity of Data Reviewed Labs: ordered. Radiology: ordered.  Risk Prescription drug management.   [name] 53 y.o. presented today for numbness and facial droop. Working DDx that I considered at this time includes, but not limited to, CVA, TIA, Bell's palsy, arthritis.  R/o DDx: Pending  Review of prior external notes: 10/18/2023 unknown  Unique Tests and My Independent Interpretation:  CBC: Unremarkable [id number]:[id number] Ethanol:Unremarkable APTT:Unremarkable PT/INR:Unremarkable Right hand x-ray: Mild DIP arthritis Right shoulder x-ray: MRI brain without contrast: pending MRI cervical spine without contrast:pending [id number]  Social Determinants of  Health: none  Discussion with Independent Historian: None  Discussion of Management of Tests: None  Risk: Medium: prescription drug management  Risk Stratification Score: None  Staffed with: Kommor MD  Plan: On exam patient was no acute distress.  On exam patient has subjective decreased sensation to right hand when compared to the left hand.  On exam patient does have right-sided facial drooping and is able to raise both eyebrows bilaterally.  Patient is not slurring words and visual fields are intact.  Symptoms in the face have been going on since last week when she was seen at the urgent care but that the hand numbness is new.  Concerned that patient may have CVA versus TIA given the fact she is able to raise both eyebrows as this is not consistent with Bell's palsy as she should have total unilateral paralysis.  Will obtain stroke panel along with MRI of brain and cervical spine after speaking with the attending.  Right shoulder x-ray was also ordered as patient was endorsing some right shoulder pain.  Patient would be out of the window for tPA as symptoms have been going on since 04/11/2024.  Patient signed out to Zelaya, PA-C.  Please review their note for the continuation of patient's care.  The plan at this point is follow-up on MRI.  This chart was dictated using voice recognition software.  Despite best efforts to proofread,  errors can occur which can change the documentation meaning.         Final Clinical Impression(s) / ED Diagnoses Final diagnoses:  None    Rx / DC Orders ED Discharge Orders     None         Mack Alvidrez T, PA-C 04/21/24 1502    Karlyn Overman, MD 04/22/24 1115

## 2024-04-21 NOTE — ED Notes (Signed)
 Patient transported to MRI

## 2024-04-21 NOTE — ED Notes (Signed)
MRI sending for patient.

## 2024-04-21 NOTE — Discharge Instructions (Addendum)
 You are seen today for concerns of arm pain and weakness.  Your labs, and imaging were thankfully reassuring no signs of any acute stroke or any obvious signs on your imaging of the neck to explain her current symptoms.  Given your recent diagnosis of Bell's palsy, I do suspect the majority of your symptoms associated with any facial findings are related to this Bell's palsy.  Please continue with your current medications.  For any concerns of new or worsening symptoms, please return to the emergency department.

## 2024-04-21 NOTE — ED Notes (Signed)
 Ativan  taken to MRI Dept and administered as patient was not tolerating MRi

## 2024-08-19 ENCOUNTER — Ambulatory Visit: Admission: EM | Admit: 2024-08-19 | Discharge: 2024-08-19 | Disposition: A | Discharge status: Home / Self Care

## 2024-08-19 DIAGNOSIS — S61214A Laceration without foreign body of right ring finger without damage to nail, initial encounter: Secondary | ICD-10-CM | POA: Diagnosis not present

## 2024-08-19 NOTE — Discharge Instructions (Addendum)
 You were seen today for a cut which is also known as a laceration.  A laceration is a type of injury that can go through all layers of the skin and sometimes into the tissue underneath. Some cuts can heal on their own, but others need to be closed to help them heal properly and reduce the risk of infection.  Your cut was treated with stitches. Keep the dressing on for the next 24 hours and do not let it get wet. After 24 hours, you can remove the dressing. Clean the area gently once a day using warm water  and antibacterial soap. After cleaning, pat the area dry with a clean towel. Do not rub the wound, scratch it, or pick at it. Avoid using disinfectants, antiseptics, ointments, creams, or liquid medicines on the wound.  Keep the area covered with a non-stick dressing for the next couple of days. If you're at home and not doing anything that could get the wound dirty, you can leave it uncovered, as air helps promote healing.  Please return here or follow up with your healthcare provider in one week to have the stitches removed. If you notice increased redness, swelling, pus, or worsening pain, contact your provider right away.

## 2024-08-19 NOTE — ED Triage Notes (Addendum)
 Lac to top of R ring finger. Occurred about 12pm. Cut on piece of glass. Unaware of last tetanus shot.

## 2024-08-19 NOTE — ED Provider Notes (Addendum)
 [name]    CSN: [id number] Arrival date & time: 08/19/24  1346      History   Chief Complaint Chief Complaint  Patient presents with   Laceration    HPI Kristine Mccarthy is a 53 y.o. female.   Discussed the use of AI scribe software for clinical note transcription with the patient, who gave verbal consent to proceed.   The patient presents with a laceration on their right ring finger. The injury occurred earlier today, before 12 o'clock, when the patient was half-asleep and carrying a glass. They report hitting a small step in their bedroom, which caused them to drop the glass and fall on top of it, resulting in the cut. The patient describes pain in the affected finger, particularly when bending it. Patient's TDAP was given last March 2023.  The following portions of the patient's history were reviewed and updated as appropriate: allergies, current medications, past family history, past medical history, past social history, past surgical history, and problem list.    Past Medical History:  Diagnosis Date   Abnormal Pap smear 1992   Leep, biopsy   Anxiety    Arthritis    Breast mass in female    Left   Bronchitis    Cervical dysplasia    level 3   PID (acute pelvic inflammatory disease)    Scoliosis    Substance abuse (HCC)    Urinary tract infection     Patient Active Problem List   Diagnosis Date Noted   Acute back pain with sciatica 04/11/2024   Idiopathic scoliosis and kyphoscoliosis 04/11/2024   Nevus 03/26/2022   Scoliosis 03/06/2022   Anxiety 03/06/2022   Ectopic pregnancy 10/10/2011    Past Surgical History:  Procedure Laterality Date   BREAST EXCISIONAL BIOPSY Left 11/26/2017   sclerosing lesion   BREAST LUMPECTOMY WITH RADIOACTIVE SEED LOCALIZATION Left 11/26/2017   Procedure: LEFT BREAST LUMPECTOMY WITH RADIOACTIVE SEED LOCALIZATION ERAS PATHWAY;  Surgeon: Vanderbilt Ned, MD;  Location: [facility] OR;  Service: General;  Laterality:  Left;   BREAST SURGERY     CERVICAL BIOPSY  W/ LOOP ELECTRODE EXCISION     oever 26 years ago   COLPOSCOPY     over 26 years ago   DILATION AND CURETTAGE OF UTERUS     INDUCED ABORTION     LEEP      OB History     Gravida  5   Para  2   Term  2   Preterm  0   AB  3   Living  2      SAB  1   IAB  1   Ectopic  1   Multiple  0   Live Births  2            Home Medications    Prior to Admission medications   Medication Sig Start Date End Date Taking? Authorizing Provider  acyclovir (ZOVIRAX) 400 MG tablet Take 400 mg by mouth as directed. 04/20/24  Yes [provider]  albuterol  (PROVENTIL  HFA;VENTOLIN  HFA) 108 (90 Base) MCG/ACT inhaler Inhale 2 puffs into the lungs every 6 (six) hours as needed for wheezing or shortness of breath.    [provider]  atorvastatin (LIPITOR) 10 MG tablet Take 10 mg by mouth at bedtime. 11/29/23   [provider]  cephALEXin  (KEFLEX ) 500 MG capsule Take 1 capsule (500 mg total) by mouth 4 (four) times daily. 09/18/23   Griselda Norris, MD  cetirizine  (ZYRTEC ) 10 MG tablet Take 1 tablet (10 mg total) by mouth daily as needed for allergies. 06/15/22   Sebastian Beverley NOVAK, MD  cyclobenzaprine (FLEXERIL) 10 MG tablet Take 10 mg by mouth at bedtime.    [provider]  DULoxetine  (CYMBALTA ) 30 MG capsule Take 1 capsule (30 mg total) by mouth daily. Patient not taking: Reported on 11/24/2022 03/26/22   Nedra Tinnie LABOR, NP  gabapentin  (NEURONTIN ) 300 MG capsule Take 1 capsule (300 mg total) by mouth 2 (two) times daily. Take 1 capsule at bedtime for 3 days, then you can increase to twice a day. May make you sleepy 05/26/22   McElwee, Lauren A, NP  HYDROcodone -acetaminophen  (NORCO) 10-325 MG tablet Take 1 tablet by mouth 4 (four) times daily as needed. 10/15/23   [provider]  ibuprofen  (ADVIL ,MOTRIN ) 200 MG tablet Take 400-800 mg by mouth every 6 (six) hours as needed for headache or moderate pain.     [provider]  methylPREDNISolone  (MEDROL  DOSEPAK) 4 MG TBPK tablet Utilize po as directed 12/23/22   Tanda Bleacher, MD  metroNIDAZOLE  (FLAGYL ) 500 MG tablet Take 1 tablet (500 mg total) by mouth 2 (two) times daily. 12/08/22   LampteyAleene KIDD, MD  omeprazole  (PRILOSEC) 40 MG capsule Take 1 capsule (40 mg total) by mouth daily. 03/26/22   McElwee, Lauren A, NP  tiZANidine  (ZANAFLEX ) 4 MG tablet Take 1 tablet (4 mg total) by mouth every 8 (eight) hours as needed. Patient not taking: Reported on 11/24/2022 03/05/22   Nedra Tinnie A, NP  traMADol  (ULTRAM ) 50 MG tablet Take 50 mg by mouth as directed.    [provider]  triamcinolone  ointment (KENALOG ) 0.5 % Apply 1 Application topically 2 (two) times daily. For moderate to severe eczema.  Do not use for more than 1 week at a time. 06/15/22   Sebastian Beverley NOVAK, MD    Family History Family History  Problem Relation Age of Onset   Cancer Maternal Grandmother        ovarian   Heart disease Maternal Grandmother    Heart disease Father    Breast cancer Paternal Aunt     Social History Social History   Tobacco Use   Smoking status: Every Day    Current packs/day: 1.00    Average packs/day: 1 pack/day for 37.0 years (37.0 ttl pk-yrs)    Types: Cigarettes   Smokeless tobacco: Never   Tobacco comments:    pt states will quit b/c she is pregnant  Vaping Use   Vaping status: Never Used  Substance Use Topics   Alcohol use: Yes    Comment: Alot recently.   Drug use: Yes    Types: Cocaine, Marijuana    Comment: Last used Cocaine yesterday     Allergies   Morphine and codeine   Review of Systems Review of Systems  Skin:  Positive for wound.  Neurological:  Negative for numbness.  All other systems reviewed and are negative.    Physical Exam Triage Vital Signs ED Triage Vitals  Encounter Vitals Group     BP 08/19/24 1546 133/83     Girls Systolic BP Percentile --      Girls Diastolic BP Percentile --       Boys Systolic BP Percentile --      Boys Diastolic BP Percentile --      Pulse Rate 08/19/24 1546 82     Resp 08/19/24 1546 18     Temp 08/19/24 1546  98 F (36.7 C)     Temp Source 08/19/24 1546 Oral     SpO2 08/19/24 1546 98 %     Weight --      Height --      Head Circumference --      Peak Flow --      Pain Score 08/19/24 1545 8     Pain Loc --      Pain Education --      Exclude from Growth Chart --    No data found.  Updated Vital Signs BP 133/83 (BP Location: Left Arm)   Pulse 82   Temp 98 F (36.7 C) (Oral)   Resp 18   LMP 07/25/2020   SpO2 98%   Visual Acuity Right Eye Distance:   Left Eye Distance:   Bilateral Distance:    Right Eye Near:   Left Eye Near:    Bilateral Near:     Physical Exam Vitals reviewed.  Constitutional:      General: She is awake. She is not in acute distress.    Appearance: Normal appearance. She is well-developed. She is not ill-appearing, toxic-appearing or diaphoretic.  HENT:     Head: Normocephalic.     Right Ear: Hearing normal.     Left Ear: Hearing normal.     Nose: Nose normal.     Mouth/Throat:     Mouth: Mucous membranes are moist.  Eyes:     General: Vision grossly intact.     Conjunctiva/sclera: Conjunctivae normal.  Cardiovascular:     Rate and Rhythm: Normal rate and regular rhythm.     Heart sounds: Normal heart sounds.  Pulmonary:     Effort: Pulmonary effort is normal.     Breath sounds: Normal breath sounds and air entry.  Musculoskeletal:        General: Normal range of motion.     Cervical back: Full passive range of motion without pain, normal range of motion and neck supple.  Skin:    General: Skin is warm and dry.     Findings: Laceration present.     Comments: Linear laceration noted to the dorsal aspect of the right ring finger measuring about 2 cm. No erythema, swelling or foreign body noted.   Neurological:     General: No focal deficit present.     Mental Status: She is alert and oriented to  person, place, and time.  Psychiatric:        Speech: Speech normal.        Behavior: Behavior is cooperative.      UC Treatments / Results  Labs (all labs ordered are listed, but only abnormal results are displayed) Labs Reviewed - No data to display  EKG   Radiology No results found.  Procedures Laceration Repair  Date/Time: 08/19/2024 4:57 PM  Performed by: Iola Lukes, FNP Authorized by: Iola Lukes, FNP   Consent:    Consent obtained:  Verbal   Consent given by:  Patient   Risks, benefits, and alternatives were discussed: yes     Risks discussed:  Pain, infection, poor cosmetic result and poor wound healing Universal protocol:    Patient identity confirmed:  Verbally with patient Anesthesia:    Anesthesia method:  Local infiltration   Local anesthetic:  Lidocaine  1% w/o epi Laceration details:    Location:  Finger   Finger location:  R ring finger Exploration:    Contaminated: no   Treatment:    Area cleansed with:  Povidone-iodine   Amount of cleaning:  Standard Skin repair:    Repair method:  Sutures   Suture size:  3-0   Suture material:  Prolene   Suture technique:  Simple interrupted   Number of sutures:  3 Approximation:    Approximation:  Close Repair type:    Repair type:  Simple Post-procedure details:    Dressing:  Non-adherent dressing   Procedure completion:  Tolerated well, no immediate complications  (including critical care time)  Medications Ordered in UC Medications - No data to display  Initial Impression / Assessment and Plan / UC Course  I have reviewed the triage vital signs and the nursing notes.  Pertinent labs & imaging results that were available during my care of the patient were reviewed by me and considered in my medical decision making (see chart for details).    The patient was evaluated for a laceration to the right fourth finger, which was repaired with sutures. Examination showed no evidence of  tendon, nerve, or vascular injury. The patient's Tdap vaccination is up to date. A finger splint was applied to prevent bending while the stitches remain in place, and the patient was advised to keep this on for the next few days. Caution was emphasized to avoid excessive finger movement that could cause the sutures to loosen or pop out. Wound care instructions were reviewed, including keeping the area clean and dry, watching for signs of infection such as redness, swelling, drainage, or increasing pain, and limiting activities that could put stress on the repair. The patient was advised to follow up in one week for suture removal and to seek immediate medical attention if symptoms of infection or fever occur.  Today's evaluation has revealed no signs of a dangerous process. Discussed diagnosis with patient and/or guardian. Patient and/or guardian aware of their diagnosis, possible red flag symptoms to watch out for and need for close follow up. Patient and/or guardian understands verbal and written discharge instructions. Patient and/or guardian comfortable with plan and disposition.  Patient and/or guardian has a clear mental status at this time, good insight into illness (after discussion and teaching) and has clear judgment to make decisions regarding their care  Documentation was completed with the aid of voice recognition software. Transcription may contain typographical errors.   Final Clinical Impressions(s) / UC Diagnoses   Final diagnoses:  None   Discharge Instructions   None    ED Prescriptions   None    PDMP not reviewed this encounter.   Iola Lukes, FNP 08/19/24 1701    Iola Lukes, FNP 08/19/24 1701    Iola Lukes, [place] 08/19/24 [phone]

## 2024-08-26 ENCOUNTER — Ambulatory Visit
Admission: RE | Admit: 2024-08-26 | Discharge: 2024-08-26 | Disposition: A | Discharge status: Home / Self Care | Source: Ambulatory Visit | Attending: Dermatology | Admitting: Dermatology

## 2024-08-26 DIAGNOSIS — Z4802 Encounter for removal of sutures: Secondary | ICD-10-CM | POA: Diagnosis not present

## 2024-08-26 NOTE — ED Triage Notes (Signed)
 Pt presents to have sutures removed. There is a total of 3 sutures to be removed from R ring finger. The wound is healing well. No concerns thus far.

## 2024-10-17 ENCOUNTER — Other Ambulatory Visit: Payer: Self-pay

## 2024-10-17 DIAGNOSIS — N6012 Diffuse cystic mastopathy of left breast: Secondary | ICD-10-CM

## 2024-10-20 ENCOUNTER — Other Ambulatory Visit: Payer: Self-pay

## 2024-10-20 DIAGNOSIS — N6012 Diffuse cystic mastopathy of left breast: Secondary | ICD-10-CM

## 2024-11-02 ENCOUNTER — Ambulatory Visit
Admission: RE | Admit: 2024-11-02 | Discharge: 2024-11-02 | Disposition: A | Discharge status: Home / Self Care | Source: Ambulatory Visit

## 2024-11-02 ENCOUNTER — Ambulatory Visit

## 2024-11-02 DIAGNOSIS — N6012 Diffuse cystic mastopathy of left breast: Secondary | ICD-10-CM
